# Patient Record
Sex: Female | Born: 1935 | Race: White | Hispanic: No | Smoking: Former smoker
Health system: Southern US, Community
[De-identification: ages and names within clinical notes are randomized; demographics above are authoritative.]

## PROBLEM LIST (undated history)

## (undated) DIAGNOSIS — I10 Essential (primary) hypertension: Secondary | ICD-10-CM

## (undated) DIAGNOSIS — C50919 Malignant neoplasm of unspecified site of unspecified female breast: Secondary | ICD-10-CM

## (undated) DIAGNOSIS — J449 Chronic obstructive pulmonary disease, unspecified: Secondary | ICD-10-CM

---

## 2004-09-23 ENCOUNTER — Ambulatory Visit: Payer: Self-pay | Admitting: Internal Medicine

## 2004-10-01 ENCOUNTER — Ambulatory Visit: Payer: Self-pay | Admitting: Unknown Physician Specialty

## 2004-11-30 ENCOUNTER — Ambulatory Visit: Payer: Self-pay | Admitting: Unknown Physician Specialty

## 2005-01-12 ENCOUNTER — Ambulatory Visit: Payer: Self-pay | Admitting: Unknown Physician Specialty

## 2006-10-23 ENCOUNTER — Ambulatory Visit: Payer: Self-pay | Admitting: Ophthalmology

## 2006-12-15 ENCOUNTER — Ambulatory Visit: Payer: Self-pay | Admitting: Internal Medicine

## 2007-07-31 ENCOUNTER — Ambulatory Visit: Payer: Self-pay | Admitting: Internal Medicine

## 2007-09-18 ENCOUNTER — Ambulatory Visit: Payer: Self-pay | Admitting: Unknown Physician Specialty

## 2007-10-02 ENCOUNTER — Ambulatory Visit: Payer: Self-pay | Admitting: Surgery

## 2007-10-03 ENCOUNTER — Ambulatory Visit: Payer: Self-pay | Admitting: Surgery

## 2008-02-27 ENCOUNTER — Ambulatory Visit: Payer: Self-pay | Admitting: Internal Medicine

## 2008-03-12 ENCOUNTER — Ambulatory Visit: Payer: Self-pay | Admitting: Unknown Physician Specialty

## 2009-04-02 ENCOUNTER — Ambulatory Visit: Payer: Self-pay | Admitting: Internal Medicine

## 2009-10-07 ENCOUNTER — Ambulatory Visit: Payer: Self-pay | Admitting: Ophthalmology

## 2009-10-19 ENCOUNTER — Ambulatory Visit: Payer: Self-pay | Admitting: Ophthalmology

## 2010-06-21 ENCOUNTER — Ambulatory Visit: Payer: Self-pay | Admitting: Internal Medicine

## 2011-09-06 ENCOUNTER — Ambulatory Visit: Payer: Self-pay | Admitting: Internal Medicine

## 2011-11-22 DIAGNOSIS — C50919 Malignant neoplasm of unspecified site of unspecified female breast: Secondary | ICD-10-CM

## 2011-11-22 HISTORY — DX: Malignant neoplasm of unspecified site of unspecified female breast: C50.919

## 2012-09-06 ENCOUNTER — Ambulatory Visit: Payer: Self-pay

## 2012-09-10 ENCOUNTER — Ambulatory Visit: Payer: Self-pay

## 2012-10-01 ENCOUNTER — Ambulatory Visit: Payer: Self-pay | Admitting: Surgery

## 2012-10-08 LAB — PATHOLOGY REPORT

## 2012-10-17 ENCOUNTER — Ambulatory Visit: Payer: Self-pay | Admitting: Surgery

## 2012-10-17 LAB — BASIC METABOLIC PANEL
Anion Gap: 3 — ABNORMAL LOW (ref 7–16)
Chloride: 102 mmol/L (ref 98–107)
Co2: 32 mmol/L (ref 21–32)
Creatinine: 0.43 mg/dL — ABNORMAL LOW (ref 0.60–1.30)
EGFR (African American): >60
EGFR (Non-African Amer.): >60
Potassium: 4.1 mmol/L (ref 3.5–5.1)
Sodium: 137 mmol/L (ref 136–145)

## 2012-10-17 LAB — CBC
MCH: 33.2 pg (ref 26.0–34.0)
Platelet: 200 10*3/uL (ref 150–440)
RBC: 3.89 10*6/uL (ref 3.80–5.20)
RDW: 14 % (ref 11.5–14.5)

## 2012-10-24 ENCOUNTER — Ambulatory Visit: Payer: Self-pay | Admitting: Surgery

## 2012-10-25 LAB — PATHOLOGY REPORT

## 2012-11-05 ENCOUNTER — Ambulatory Visit: Payer: Self-pay | Admitting: Internal Medicine

## 2012-11-21 ENCOUNTER — Ambulatory Visit: Payer: Self-pay | Admitting: Internal Medicine

## 2012-12-10 LAB — CBC CANCER CENTER
Basophil #: 0.1 x10 3/mm (ref 0.0–0.1)
Basophil %: 0.8 %
Eosinophil #: 0.2 x10 3/mm (ref 0.0–0.7)
Eosinophil %: 3.2 %
HCT: 40.4 % (ref 35.0–47.0)
HGB: 13.7 g/dL (ref 12.0–16.0)
Lymphocyte #: 1.8 x10 3/mm (ref 1.0–3.6)
Lymphocyte %: 27.9 %
MCH: 32.3 pg (ref 26.0–34.0)
MCHC: 33.9 g/dL (ref 32.0–36.0)
MCV: 95 fL (ref 80–100)
Monocyte #: 0.6 x10 3/mm (ref 0.2–0.9)
Neutrophil #: 3.8 x10 3/mm (ref 1.4–6.5)
RBC: 4.24 10*6/uL (ref 3.80–5.20)
RDW: 13.4 % (ref 11.5–14.5)
WBC: 6.4 x10 3/mm (ref 3.6–11.0)

## 2012-12-17 LAB — CBC CANCER CENTER
Basophil #: 0.1 x10 3/mm (ref 0.0–0.1)
Basophil %: 0.8 %
Eosinophil %: 2.8 %
HCT: 40.2 % (ref 35.0–47.0)
HGB: 13.4 g/dL (ref 12.0–16.0)
Lymphocyte #: 1.6 x10 3/mm (ref 1.0–3.6)
MCHC: 33.3 g/dL (ref 32.0–36.0)
Monocyte #: 0.6 x10 3/mm (ref 0.2–0.9)
Neutrophil #: 4.3 x10 3/mm (ref 1.4–6.5)
Neutrophil %: 64 %
Platelet: 189 x10 3/mm (ref 150–440)
RBC: 4.17 10*6/uL (ref 3.80–5.20)
WBC: 6.7 x10 3/mm (ref 3.6–11.0)

## 2012-12-22 ENCOUNTER — Ambulatory Visit: Payer: Self-pay | Admitting: Internal Medicine

## 2012-12-24 LAB — CBC CANCER CENTER
Basophil %: 0.8 %
Eosinophil #: 0.2 x10 3/mm (ref 0.0–0.7)
Eosinophil %: 2.6 %
HCT: 39.1 % (ref 35.0–47.0)
HGB: 13.2 g/dL (ref 12.0–16.0)
Lymphocyte #: 1.4 x10 3/mm (ref 1.0–3.6)
Lymphocyte %: 23.4 %
MCH: 32.3 pg (ref 26.0–34.0)
MCHC: 33.8 g/dL (ref 32.0–36.0)
MCV: 96 fL (ref 80–100)
Monocyte #: 0.6 x10 3/mm (ref 0.2–0.9)
Monocyte %: 9.7 %
Neutrophil #: 3.8 x10 3/mm (ref 1.4–6.5)
Neutrophil %: 63.5 %
RBC: 4.08 10*6/uL (ref 3.80–5.20)
RDW: 13.7 % (ref 11.5–14.5)

## 2012-12-31 LAB — CREATININE, SERUM
EGFR (African American): >60
EGFR (Non-African Amer.): >60

## 2012-12-31 LAB — CBC CANCER CENTER
Basophil %: 0.9 %
Eosinophil #: 0.2 x10 3/mm (ref 0.0–0.7)
Eosinophil %: 2.8 %
Lymphocyte #: 1.4 x10 3/mm (ref 1.0–3.6)
MCH: 32.2 pg (ref 26.0–34.0)
MCHC: 33.9 g/dL (ref 32.0–36.0)
Monocyte %: 10.8 %
Neutrophil %: 60.8 %
Platelet: 166 x10 3/mm (ref 150–440)
RBC: 4.05 10*6/uL (ref 3.80–5.20)
RDW: 13.5 % (ref 11.5–14.5)

## 2012-12-31 LAB — HEPATIC FUNCTION PANEL A (ARMC)
Albumin: 3.5 g/dL (ref 3.4–5.0)
Alkaline Phosphatase: 123 U/L (ref 50–136)
Bilirubin, Direct: <0.05 mg/dL (ref 0.00–0.20)
Bilirubin,Total: 0.3 mg/dL (ref 0.2–1.0)
SGOT(AST): 16 U/L (ref 15–37)
SGPT (ALT): 23 U/L (ref 12–78)
Total Protein: 7.1 g/dL (ref 6.4–8.2)

## 2013-01-07 LAB — CBC CANCER CENTER
Basophil #: 0.1 x10 3/mm (ref 0.0–0.1)
Eosinophil #: 0.2 x10 3/mm (ref 0.0–0.7)
Eosinophil %: 2.7 %
HCT: 37.8 % (ref 35.0–47.0)
Lymphocyte #: 1.3 x10 3/mm (ref 1.0–3.6)
Lymphocyte %: 22.7 %
MCH: 32.2 pg (ref 26.0–34.0)
MCHC: 34 g/dL (ref 32.0–36.0)
MCV: 95 fL (ref 80–100)
Neutrophil #: 3.8 x10 3/mm (ref 1.4–6.5)
Neutrophil %: 64 %
Platelet: 178 x10 3/mm (ref 150–440)
RBC: 3.98 10*6/uL (ref 3.80–5.20)
RDW: 14.2 % (ref 11.5–14.5)

## 2013-01-14 LAB — CBC CANCER CENTER
Basophil #: 0 x10 3/mm (ref 0.0–0.1)
Basophil %: 0.7 %
Eosinophil #: 0.2 x10 3/mm (ref 0.0–0.7)
Lymphocyte #: 1.4 x10 3/mm (ref 1.0–3.6)
MCH: 32.3 pg (ref 26.0–34.0)
MCV: 95 fL (ref 80–100)
Monocyte #: 0.8 x10 3/mm (ref 0.2–0.9)
Monocyte %: 11.6 %
Neutrophil #: 4.2 x10 3/mm (ref 1.4–6.5)
Neutrophil %: 63.8 %
Platelet: 191 x10 3/mm (ref 150–440)
RDW: 13.8 % (ref 11.5–14.5)

## 2013-01-19 ENCOUNTER — Ambulatory Visit: Payer: Self-pay | Admitting: Internal Medicine

## 2013-02-19 ENCOUNTER — Ambulatory Visit: Payer: Self-pay | Admitting: Internal Medicine

## 2013-03-21 ENCOUNTER — Ambulatory Visit: Payer: Self-pay | Admitting: Internal Medicine

## 2013-05-09 IMAGING — CT CT SIM MISC
1 series · 16 of 34 positions shown, 20 images · non-contrast
Comparison: none

[Series 2: — · axial · 1.17mm/px · z∈[-83,+115]mm · 16 of 75 slices shown, 20 images]
[im 6/75  mediastinal]
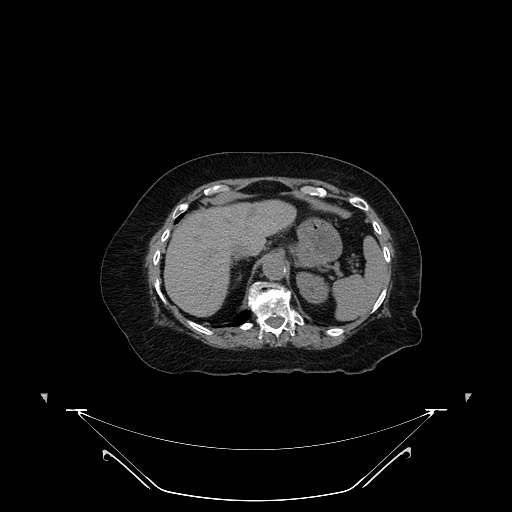
[im 6/75  lung]
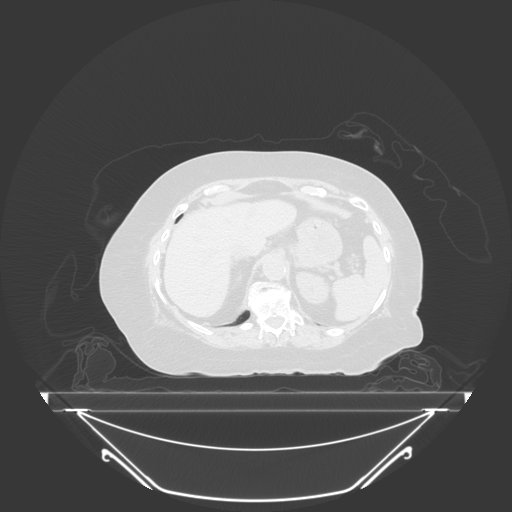
[im 11/75  lung]
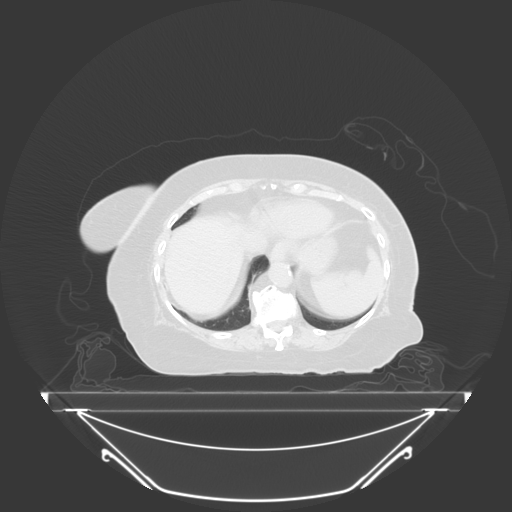
[im 15/75  lung]
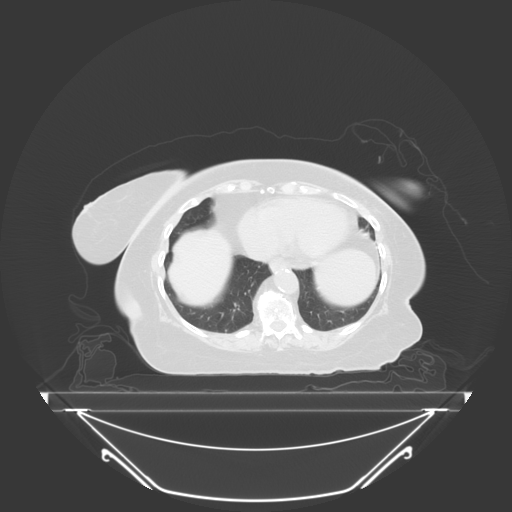
[im 20/75  lung]
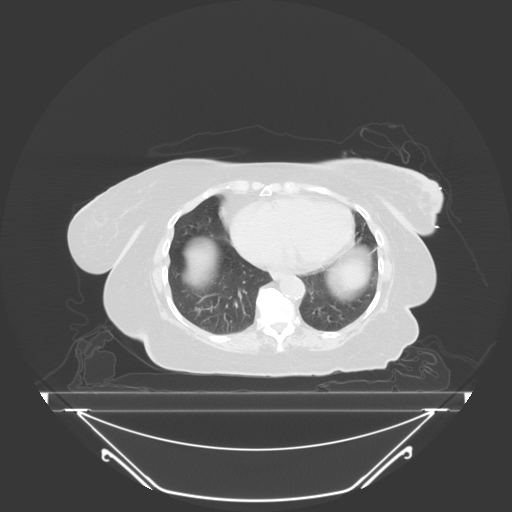
[im 25/75  mediastinal]
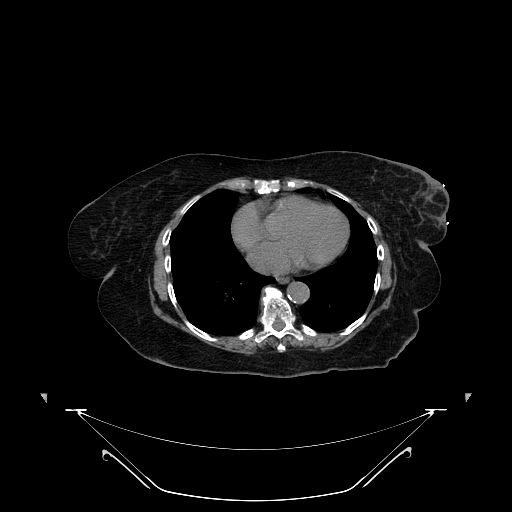
[im 25/75  lung]
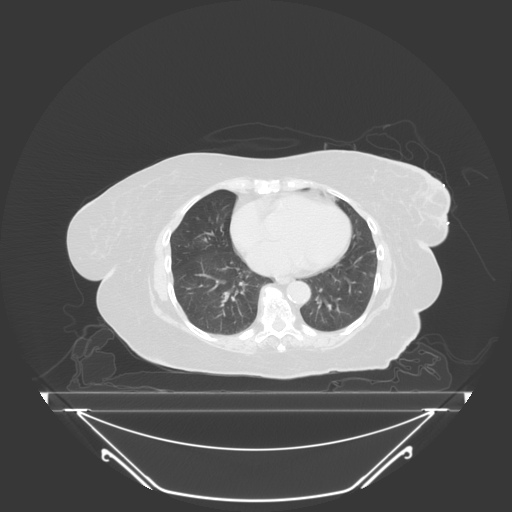
[im 30/75  lung]
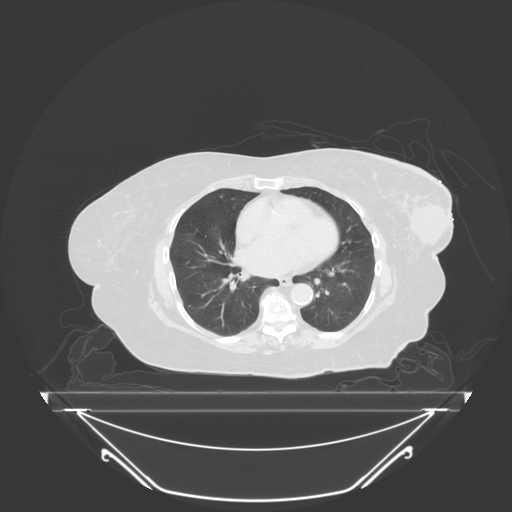
[im 33/75  lung]
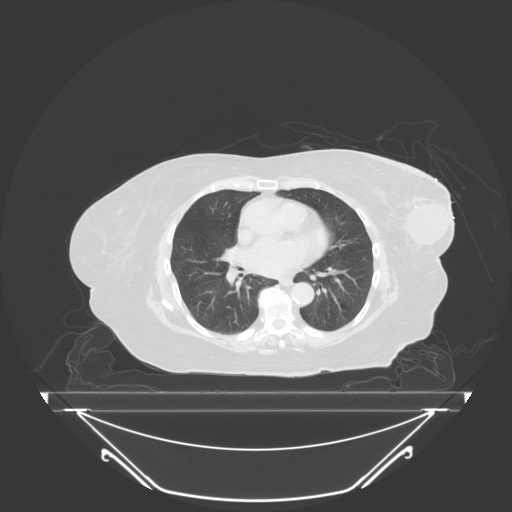
[im 36/75  lung]
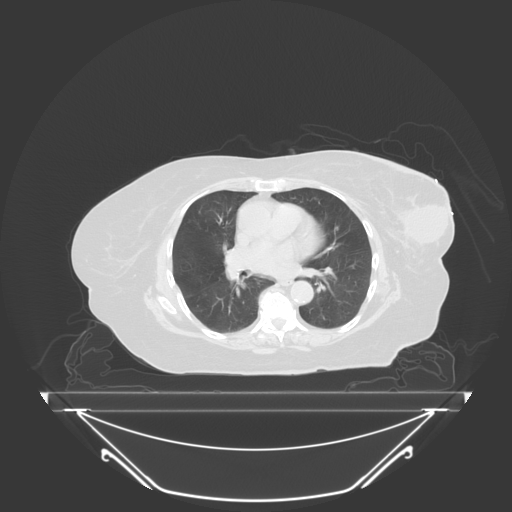
[im 41/75  mediastinal]
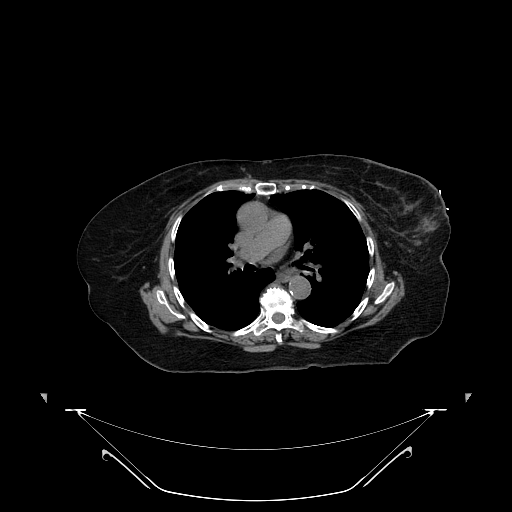
[im 41/75  lung]
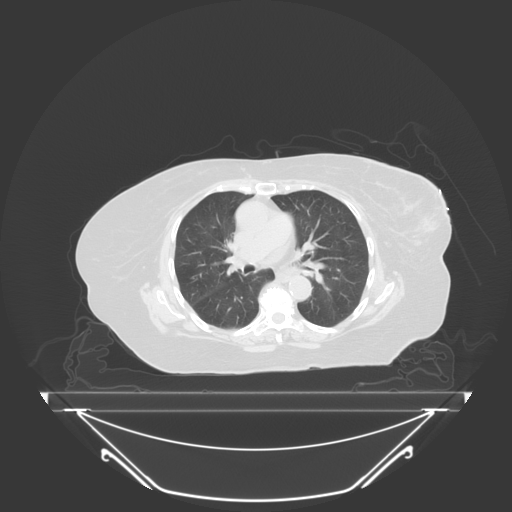
[im 44/75  lung]
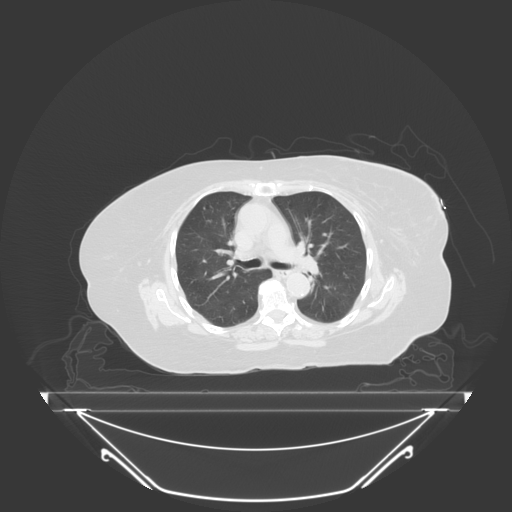
[im 47/75  lung]
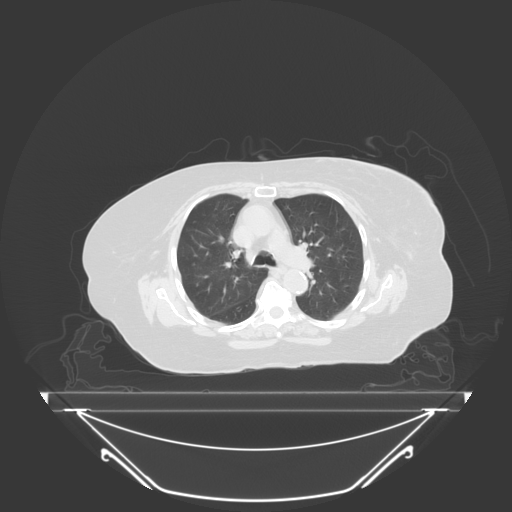
[im 53/75  lung]
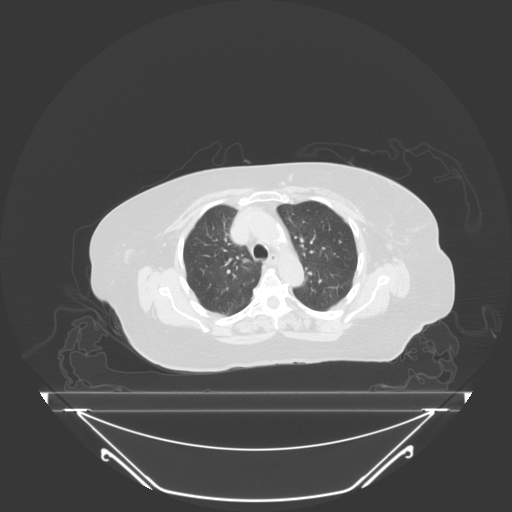
[im 58/75  mediastinal]
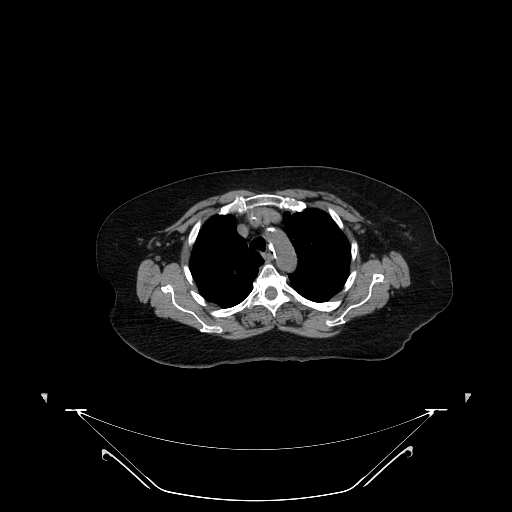
[im 58/75  lung]
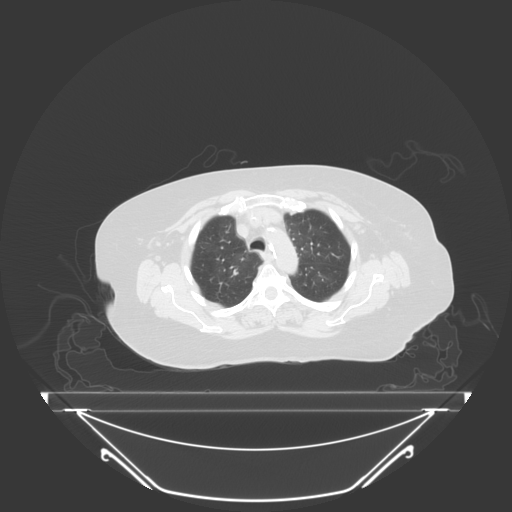
[im 61/75  lung]
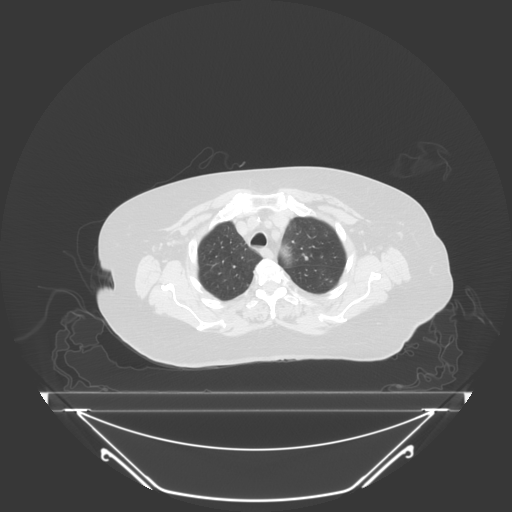
[im 66/75  lung]
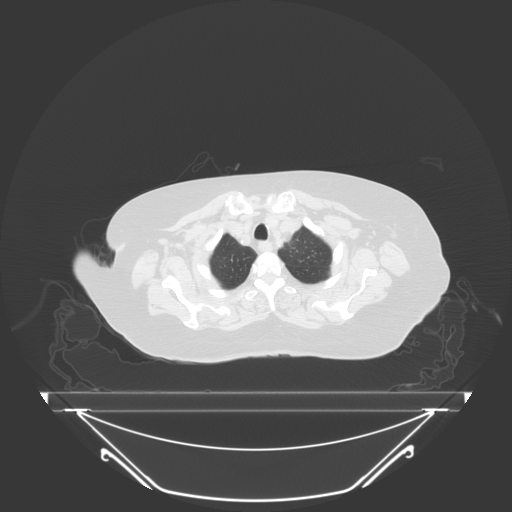
[im 72/75  lung]
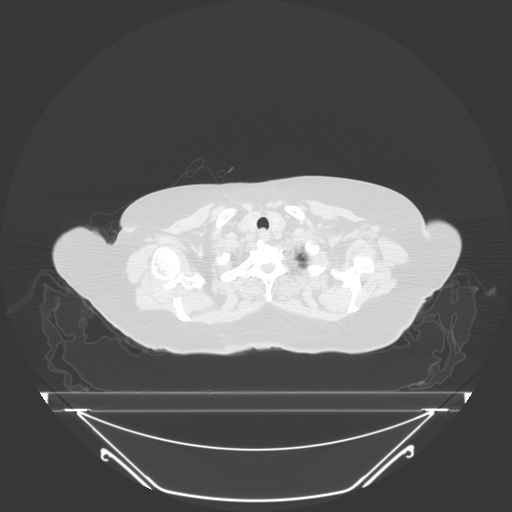

[16 of 34 positions shown; findings below may reference images not displayed]

IMAGES IMPORTED FROM THE [employer] WORKFLOW SYSTEM
NO DICTATION FOR STUDY

## 2013-05-14 IMAGING — CT CT SIM MISC
1 series · 16 of 33 positions shown, 20 images · non-contrast
Comparison: none

[Series 2: — · axial · 1.17mm/px · z∈[-672,-447]mm · 16 of 81 slices shown, 20 images]
[im 3/81  mediastinal]
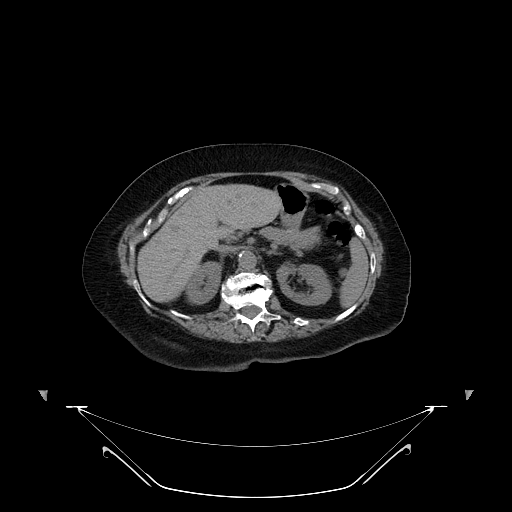
[im 3/81  lung]
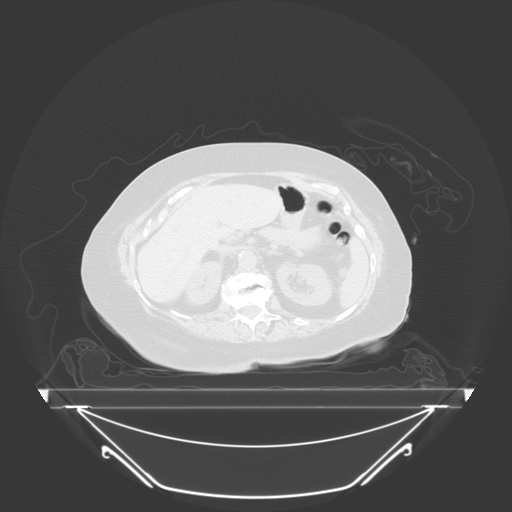
[im 9/81  lung]
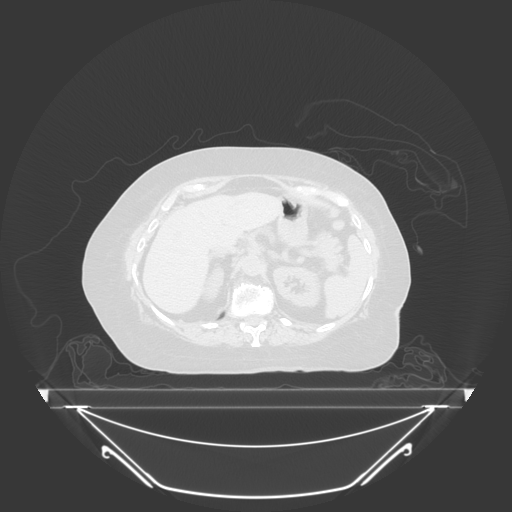
[im 15/81  lung]
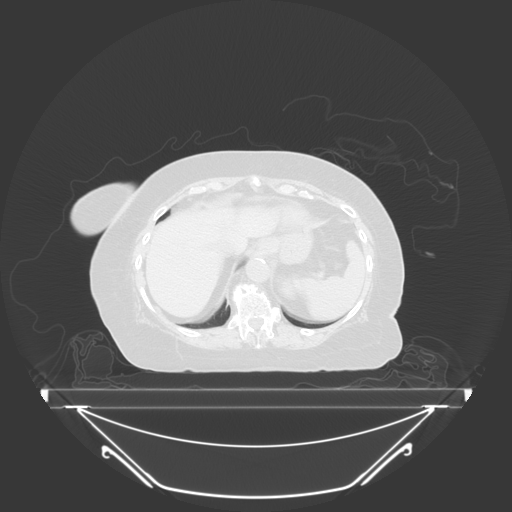
[im 18/81  lung]
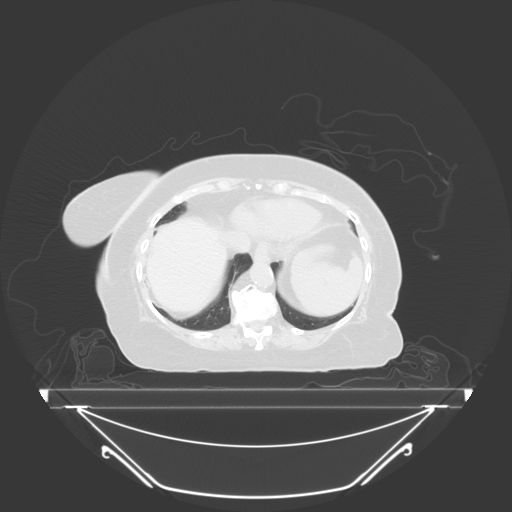
[im 24/81  mediastinal]
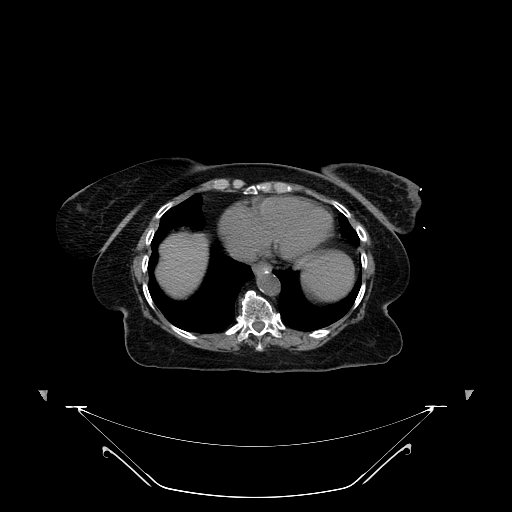
[im 24/81  lung]
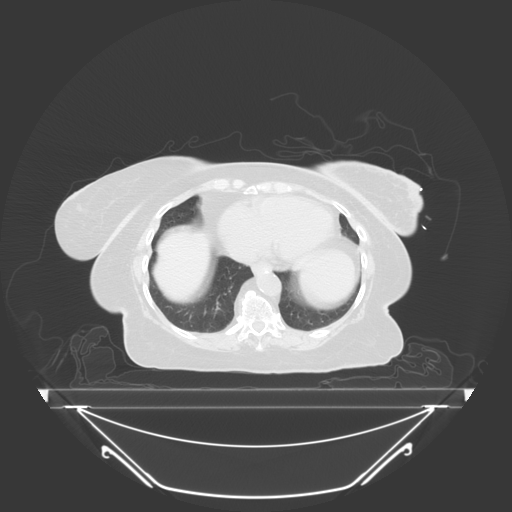
[im 30/81  lung]
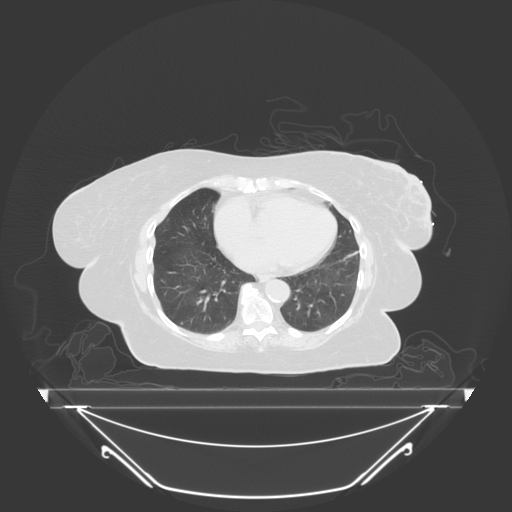
[im 36/81  lung]
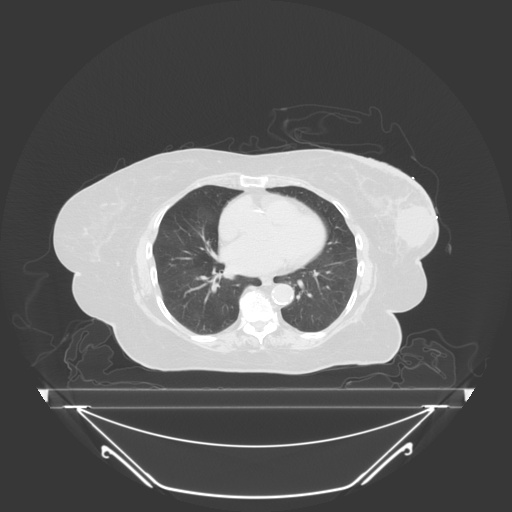
[im 39/81  lung]
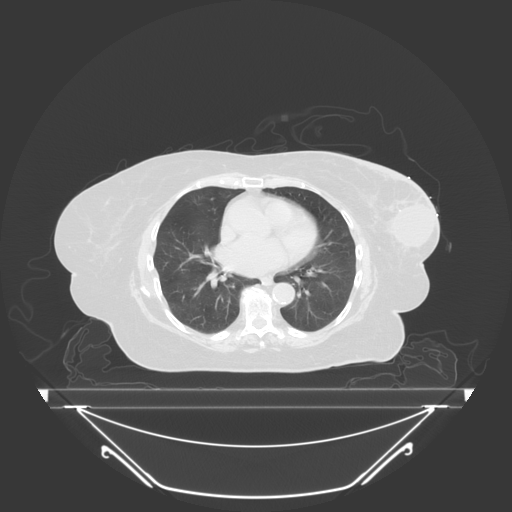
[im 44/81  mediastinal]
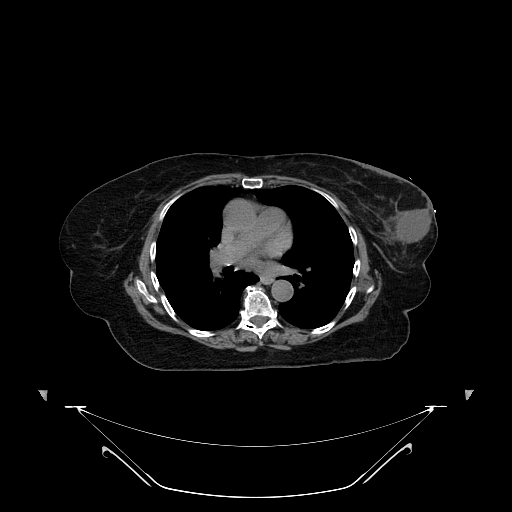
[im 44/81  lung]
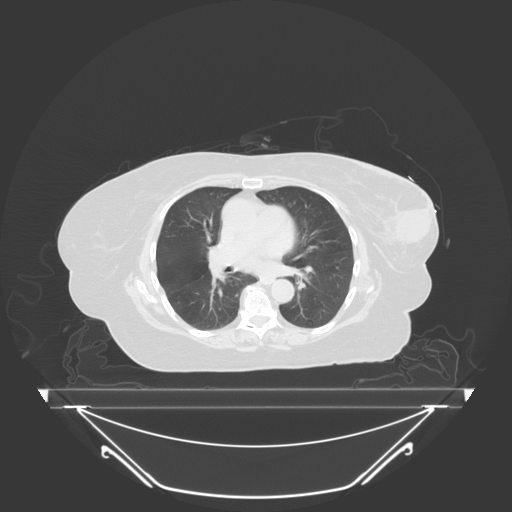
[im 48/81  lung]
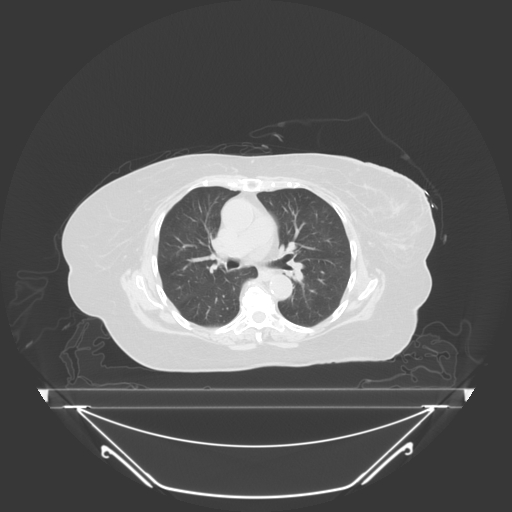
[im 51/81  lung]
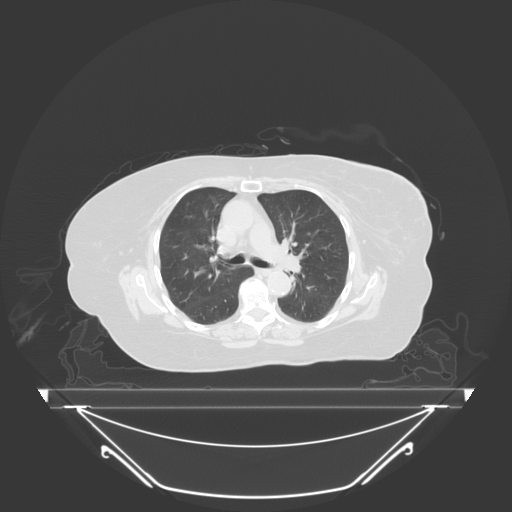
[im 57/81  lung]
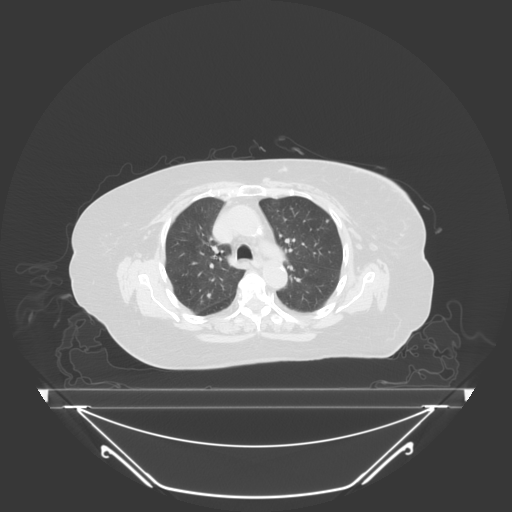
[im 63/81  mediastinal]
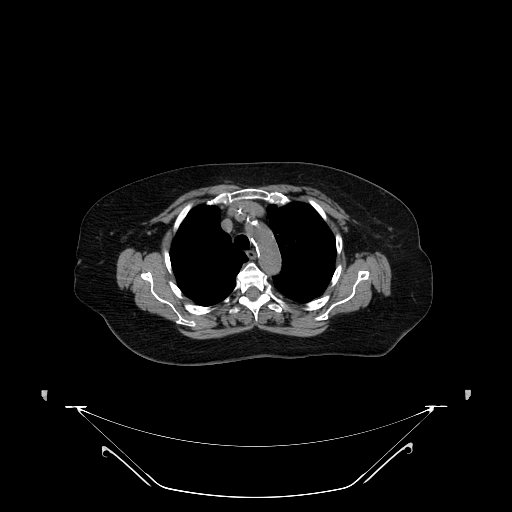
[im 63/81  lung]
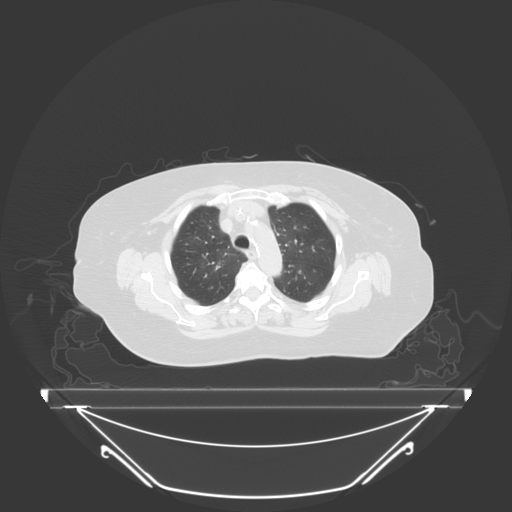
[im 66/81  lung]
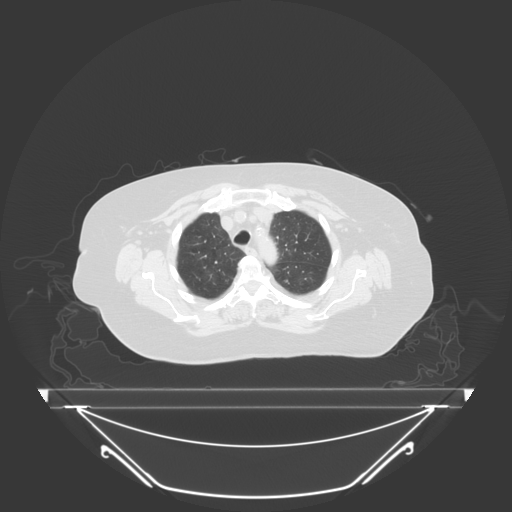
[im 72/81  lung]
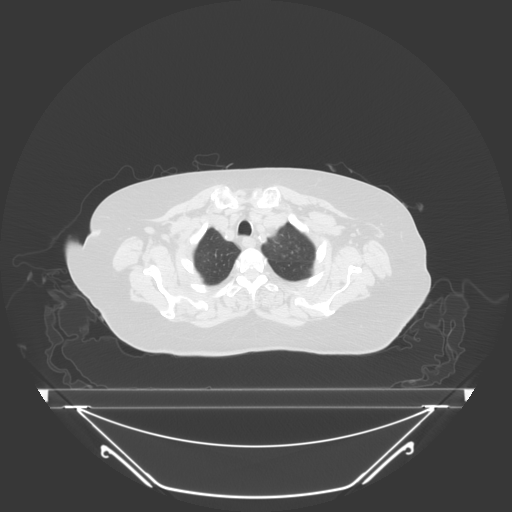
[im 78/81  lung]
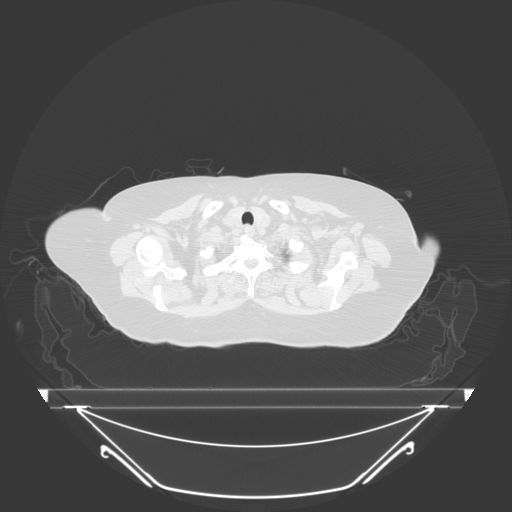

[16 of 33 positions shown; findings below may reference images not displayed]

IMAGES IMPORTED FROM THE [employer] WORKFLOW SYSTEM
NO DICTATION FOR STUDY

## 2013-08-12 ENCOUNTER — Ambulatory Visit: Payer: Self-pay | Admitting: Internal Medicine

## 2013-08-21 ENCOUNTER — Ambulatory Visit: Payer: Self-pay | Admitting: Internal Medicine

## 2013-10-23 ENCOUNTER — Ambulatory Visit: Payer: Self-pay

## 2013-11-22 ENCOUNTER — Ambulatory Visit: Payer: Self-pay | Admitting: Cardiology

## 2014-05-27 ENCOUNTER — Ambulatory Visit: Payer: Self-pay

## 2014-08-12 ENCOUNTER — Ambulatory Visit: Payer: Self-pay | Admitting: Internal Medicine

## 2014-08-21 ENCOUNTER — Ambulatory Visit: Payer: Self-pay | Admitting: Internal Medicine

## 2014-11-12 ENCOUNTER — Ambulatory Visit: Payer: Self-pay

## 2015-03-10 NOTE — Consult Note (Signed)
Reason for Visit: This 81 year old Female patient presents to the clinic for initial evaluation of  breast cancer .   Referred by Dr. Tamala Julian.  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 She had stereotactic needle biopsy showing ductal carcinoma in situ ER/PR positive. Went on to have a wide local excision for a 0.9 cm area of ductal carcinoma in situ intermediate grade 2 there was necrosis present margins were clear at one CM. She tolerated her surgery well. Surgery was October 24, 2012. She's been seen by medical oncology and recommendation for tamoxifen therapy has been made. She is now referred to radiation oncology for consideration of treatment. She is doing well breast is healing well. Specifically denies breast tenderness cough or bone pain.  Past Hx:    Asthma:    Glucoma:    Cataracts:    Lumpectomy:    Partial Hysterectomy:   Past, Family and Social History:   Past Medical History positive    Cardiovascular hypertension    Respiratory asthma; bronchitis    Endocrine glucose intolerance    Past Surgical History cholecystectomy; hysterectomy, cut cataract surgery carpal tunnel surgery C-section x4    Past Medical History Comments arthritis, obesity    Family History positive    Family History Comments family history of congestive heart disease stroke and son with bladder cancer at    Social History positive    Social History Comments 40-pack-year smoking history quit smoking in  76 no EtOH abuse history    Additional Past Medical and Surgical History accompanied by a friend today   Allergies:   Anti-inflammatories: Unknown  Celebrex: Resp. Distress, SOB, Wheezing  ASA: Other, Bleeding  Aspirin: Bleeding  Penicillin: Rash  Home Meds:  Home Medications: Medication Instructions Status  Flovent HFA 110 mcg/inh inhalation aerosol 2 puff(s) inhaled 2 times a day Active  hydrochlorothiazide-triamterene 25 mg-37.5 mg oral tablet 1 tab(s) orally once a day, after lunch Active  Combivent 18 mcg-103 mcg-/inh inhalation aerosol 1 puff(s) inhaled every 6 hours, As Needed Active   Review of Systems:   General negative    Performance Status (ECOG) 0    Skin negative    Breast see HPI    Ophthalmologic negative    ENMT negative    Respiratory and Thorax see HPI    Cardiovascular negative    Gastrointestinal negative    Genitourinary negative    Musculoskeletal negative    Neurological negative    Psychiatric negative    Hematology/Lymphatics negative    Endocrine negative    Allergic/Immunologic negative    Review of Systems   according to nurse's notes Patient denies any weight loss, fatigue, weakness, fever, chills or night sweats. Patient denies any loss of vision, blurred vision. Patient denies any ringing  of the ears or hearing loss. No irregular heartbeat. Patient denies heart murmur or history of fainting. Patient denies any chest pain or pain radiating to her upper extremities. Patient denies any shortness of breath, difficulty breathing  at night, cough or hemoptysis. Patient denies any swelling in the lower legs. Patient denies any nausea vomiting, vomiting of blood, or coffee ground material in the vomitus. Patient denies any stomach pain. Patient states has had normal bowel movements no significant constipation or diarrhea. Patient denies any dysuria, hematuria or significant nocturia. Patient denies any problems walking, swelling in the  joints or loss of balance. Patient denies any skin changes, loss of hair or loss of weight. Patient denies any excessive worrying or anxiety or significant depression. Patient denies any problems with insomnia. Patient denies excessive thirst, polyuria, polydipsia. Patient denies any swollen glands, patient denies easy bruising or easy bleeding. Patient denies any recent infections, allergies or URI. Patient "s visual fields have not changed significantly in recent time.  Nursing Notes:  Nursing Vital Signs and Chemo Nursing Nursing Notes: *CC Vital Signs Flowsheet:   16-Dec-13 11:18   Temp Temperature 96.7   Pulse Pulse 75   Respirations Respirations 18   SBP SBP 160   DBP DBP 80   Pain Scale (0-10)  0   Current Weight (kg) (kg) 85.7   Height (cm) centimeters 149.8   BSA (m2) 1.7   Physical Exam:  General/Skin/HEENT:   General normal    Skin normal    Eyes normal    ENMT normal    Head and Neck normal    Additional PE well-developed slightly obese female in NAD. Lungs are clear to A&P cardiac examination shows regular rate and rhythm. Left breast is a wide local excision scar that is healing well. No dominant mass or nodularity is noted in either breast in 2 position examined. No axillary or supraclavicular adenopathy is identified.   Breasts/Resp/CV/GI/GU:   Respiratory and Thorax normal    Cardiovascular normal    Gastrointestinal normal    Genitourinary normal   MS/Neuro/Psych/Lymph:   Musculoskeletal normal    Neurological normal    Lymphatics normal   Assessment and Plan:  Impression:   ductal carcinoma in situ of the left breast status post wide local excision ER/PR positive and 64-year-old female  Plan:   this time I have recommended whole breast radiation. She is rather large breasted slight like to take it slow on her dose we'll treat up to 5000 cGy over 5 weeks. We'll also boost or scar another 1400 cGy using electrons. Risks and benefits of treatment  were reviewed with the patient and she seems to comprehend my treatment plan well. I have set her up for CT simulation shortly after the Christmas holiday. Patient will also be a candidate for tamoxifen after completion of radiation and will refer her back to Dr. Ma Hillock for that.risks and benefits of treatment including irritation of the skin, fibrosis of the breast, fatigue, alteration blood counts, and fatigue were all explained in detail to the patient.I discussed the case personally with Dr. Ma Hillock who concurs with the treatment plan.  I would like to take this opportunity to thank you for allowing me to continue to participate in this patient's care.    CC Referral:   cc: Dr. Tamala Julian, Dr. Adrian Prows   Electronic Signatures: Baruch Gouty, Roda Shutters (MD)  (Signed 16-Dec-13 15:21)  Authored: HPI, Diagnosis, Past Hx, PFSH, Allergies, Home Meds, ROS, Nursing Notes, Physical Exam, Encounter Assessment and Plan, CC Referring Physician   Last Updated: 16-Dec-13 15:21 by Armstead Peaks (MD)

## 2015-03-10 NOTE — Op Note (Signed)
PATIENT NAME:  Brenda Collins, Brenda Collins MR#:  [id number] DATE OF BIRTH:  01-22-1930  DATE OF PROCEDURE:  10/24/2012  PREOPERATIVE DIAGNOSIS: Carcinoma of the left breast.   POSTOPERATIVE DIAGNOSIS: Carcinoma of the left breast.   PROCEDURE: Left partial mastectomy.   SURGEON: Loreli Dollar, MD  ANESTHESIA: General.   INDICATIONS: This 81 year old female recently had a mammogram depicting a cluster of microcalcifications in the outer aspect of the left breast. She had core needle biopsy with findings of ductal carcinoma in situ and surgery was recommended for definitive treatment. It is also noted in the preoperative physical exam it was noted she had a hematoma surrounding the biopsy site. Some short period of time was allowed for resolution.   DESCRIPTION OF PROCEDURE: She is brought to the Operating Room for definitive procedure. Did have preoperative insertion of Kopans wire. The mammograms were reviewed seeing the Kopans wire and the location of the biopsy marker and hematoma. Patient was placed on the operating table in the supine position under general anesthesia. The dressing was removed from the left breast exposing the Kopans wire which entered the breast at the 3:00 position. The Kopans wire was cut 2 cm from the skin. The breast was prepared with ChloraPrep, draped in a sterile manner.   An incision was made from approximately 2:00 to 4:00 position of the peripheral aspect of the left breast directly overlying the palpable mass and also overlying the site of the entrance point of Kopans wire. An ellipse of skin approximately 12 mm wide was excised with the underlying specimen. Dissection was carried down through subcutaneous tissues and the hematoma was palpated. The Kopans wire was observed and a mass of breast tissue was excised using electrocautery for hemostasis. One small artery was suture ligated with 4-0 chromic. Further dissection carried out to completely excise the mass and the mass  itself with the surrounding tissue was approximately 4 x 4 x 6 cm in dimension and was marked with margin maps, suturing markers to the specimen to mark the cranial, caudal, medial, lateral margins and also the 4:00 position of the skin ellipse was tagged with a 3-0 nylon stitch. The specimen was submitted for pathology.   Several small bleeding points were cauterized. The wound was observed and hemostasis was subsequently determined to be intact. Deeper tissues were infiltrated with 0.5% Sensorcaine with epinephrine and also subcutaneous tissues. Next, subcutaneous tissues were closed with interrupted 4-0 chromic inverted sutures. Next, the skin was closed with running 4-0 Monocryl subcuticular suture.   With time the pathologist called back to indicate that the margins appeared clear and the biopsy site was in the central aspect of the specimen and was submitted for routine pathology. The wound was treated with Dermabond. Patient tolerated surgery satisfactorily and was then prepared for transfer to the recovery room.    ____________________________ Lenna Sciara. Rochel Brome, MD jws:cms D: 10/24/2012 12:50:39 ET T: 10/24/2012 13:05:53 ET JOB#: [id number]  cc: Loreli Dollar, MD, <Dictator> Loreli Dollar MD ELECTRONICALLY SIGNED 10/24/2012 19:47

## 2015-08-12 ENCOUNTER — Encounter: Payer: Self-pay | Admitting: Radiation Oncology

## 2015-08-12 ENCOUNTER — Ambulatory Visit
Admission: RE | Admit: 2015-08-12 | Discharge: 2015-08-12 | Disposition: A | Discharge status: Home / Self Care | Payer: Medicare Other | Source: Ambulatory Visit | Attending: Radiation Oncology | Admitting: Radiation Oncology

## 2015-08-12 VITALS — BP 183/80 | HR 61 | Temp 97.6°F | Resp 20 | Wt 170.2 lb

## 2015-08-12 DIAGNOSIS — C50912 Malignant neoplasm of unspecified site of left female breast: Secondary | ICD-10-CM

## 2015-08-12 HISTORY — DX: Chronic obstructive pulmonary disease, unspecified: J44.9

## 2015-08-12 HISTORY — DX: Malignant neoplasm of unspecified site of unspecified female breast: C50.919

## 2015-08-12 HISTORY — DX: Essential (primary) hypertension: I10

## 2015-08-12 NOTE — Progress Notes (Signed)
Radiation Oncology Follow up Note  Name: Brenda Collins   Date:   08/12/2015 MRN:  [id number] DOB: 1930/06/29    This 81 y.o. female presents to the clinic today for follow-up for breast cancer.  REFERRING Katherina Wimer: No ref. Sefora Tietje found  HPI: Patient is an 81 year old female now out over 3 and half years having completed radiation therapy to her left breast for ductal carcinoma in situ ER/PR positive. Patient has significant arthritis and has refused tamoxifen therapy. She is seen today in routine follow-up and is doing well. She does complain of significant joint pain especially in her hands. She had a mammogram back in December which was fine another one is scheduled for the next several months. She specifically denies breast tenderness cough or bone pain.  COMPLICATIONS OF TREATMENT: none  FOLLOW UP COMPLIANCE: keeps appointments   PHYSICAL EXAM:  BP 183/80 mmHg  Pulse 61  Temp(Src) 97.6 F (36.4 C)  Resp 20  Wt 170 lb 3.1 oz (77.2 kg) Lungs are clear to A&P cardiac examination essentially unremarkable with regular rate and rhythm. No dominant mass or nodularity is noted in either breast in 2 positions examined. Incision is well-healed. No axillary or supraclavicular adenopathy is appreciated. Cosmetic result is excellent. Still some cholangiolar attic changes of the skin in her high-dose scar boost region although cosmetic result is still good to excellent. Well-developed well-nourished patient in NAD. HEENT reveals PERLA, EOMI, discs not visualized.  Oral cavity is clear. No oral mucosal lesions are identified. Neck is clear without evidence of cervical or supraclavicular adenopathy. Lungs are clear to A&P. Cardiac examination is essentially unremarkable with regular rate and rhythm without murmur rub or thrill. Abdomen is benign with no organomegaly or masses noted. Motor sensory and DTR levels are equal and symmetric in the upper and lower extremities. Cranial nerves II through XII  are grossly intact. Proprioception is intact. No peripheral adenopathy or edema is identified. No motor or sensory levels are noted. Crude visual fields are within normal range.   RADIOLOGY RESULTS: Prior mammograms are reviewed  PLAN: At the present time she continues to do well with no evidence of disease. I've asked to see her back in 1 year for follow-up and then will discontinue follow-up care as will be close to 5 years out. Patient knows to call with any concerns at any time.  I would like to take this opportunity for allowing me to participate in the care of your patient.Armstead Peaks., MD

## 2016-08-03 ENCOUNTER — Encounter: Payer: Self-pay | Admitting: Emergency Medicine

## 2016-08-03 ENCOUNTER — Emergency Department: Payer: Medicare Other

## 2016-08-03 ENCOUNTER — Inpatient Hospital Stay
Admission: EM | Admit: 2016-08-03 | Discharge: 2016-08-04 | DRG: 309 | Disposition: A | Discharge status: Home / Self Care | Payer: Medicare Other | Attending: Specialist | Admitting: Specialist

## 2016-08-03 DIAGNOSIS — I480 Paroxysmal atrial fibrillation: Principal | ICD-10-CM | POA: Diagnosis present

## 2016-08-03 DIAGNOSIS — E871 Hypo-osmolality and hyponatremia: Secondary | ICD-10-CM

## 2016-08-03 DIAGNOSIS — R778 Other specified abnormalities of plasma proteins: Secondary | ICD-10-CM

## 2016-08-03 DIAGNOSIS — Z880 Allergy status to penicillin: Secondary | ICD-10-CM

## 2016-08-03 DIAGNOSIS — Z79899 Other long term (current) drug therapy: Secondary | ICD-10-CM | POA: Diagnosis not present

## 2016-08-03 DIAGNOSIS — R0600 Dyspnea, unspecified: Secondary | ICD-10-CM

## 2016-08-03 DIAGNOSIS — I4891 Unspecified atrial fibrillation: Secondary | ICD-10-CM | POA: Diagnosis present

## 2016-08-03 DIAGNOSIS — I200 Unstable angina: Secondary | ICD-10-CM

## 2016-08-03 DIAGNOSIS — J449 Chronic obstructive pulmonary disease, unspecified: Secondary | ICD-10-CM | POA: Diagnosis present

## 2016-08-03 DIAGNOSIS — I272 Other secondary pulmonary hypertension: Secondary | ICD-10-CM | POA: Diagnosis present

## 2016-08-03 DIAGNOSIS — Z87891 Personal history of nicotine dependence: Secondary | ICD-10-CM | POA: Diagnosis not present

## 2016-08-03 DIAGNOSIS — T502X5A Adverse effect of carbonic-anhydrase inhibitors, benzothiadiazides and other diuretics, initial encounter: Secondary | ICD-10-CM | POA: Diagnosis present

## 2016-08-03 DIAGNOSIS — Z853 Personal history of malignant neoplasm of breast: Secondary | ICD-10-CM | POA: Diagnosis not present

## 2016-08-03 DIAGNOSIS — I10 Essential (primary) hypertension: Secondary | ICD-10-CM | POA: Diagnosis present

## 2016-08-03 DIAGNOSIS — Z888 Allergy status to other drugs, medicaments and biological substances status: Secondary | ICD-10-CM | POA: Diagnosis not present

## 2016-08-03 DIAGNOSIS — R7989 Other specified abnormal findings of blood chemistry: Secondary | ICD-10-CM

## 2016-08-03 LAB — CBC
HCT: 43.1 % (ref 35.0–47.0)
HEMOGLOBIN: 14.9 g/dL (ref 12.0–16.0)
MCH: 32.5 pg (ref 26.0–34.0)
MCHC: 34.6 g/dL (ref 32.0–36.0)
MCV: 93.8 fL (ref 80.0–100.0)
PLATELETS: 293 10*3/uL (ref 150–440)
RBC: 4.59 MIL/uL (ref 3.80–5.20)
RDW: 13.8 % (ref 11.5–14.5)
WBC: 11.6 10*3/uL — ABNORMAL HIGH (ref 3.6–11.0)

## 2016-08-03 LAB — BASIC METABOLIC PANEL
ANION GAP: 11 (ref 5–15)
BUN: 19 mg/dL (ref 6–20)
CALCIUM: 9.8 mg/dL (ref 8.9–10.3)
CO2: 24 mmol/L (ref 22–32)
Chloride: 92 mmol/L — ABNORMAL LOW (ref 101–111)
Creatinine, Ser: 0.63 mg/dL (ref 0.44–1.00)
GFR calc non Af Amer: >60 mL/min (ref >60)
Glucose, Bld: 169 mg/dL — ABNORMAL HIGH (ref 65–99)
Potassium: 3.8 mmol/L (ref 3.5–5.1)
SODIUM: 127 mmol/L — AB (ref 135–145)

## 2016-08-03 LAB — TSH: TSH: 1.144 u[IU]/mL (ref 0.350–4.500)

## 2016-08-03 LAB — TROPONIN I: TROPONIN I: 0.03 ng/mL — AB (ref <0.03)

## 2016-08-03 LAB — PHOSPHORUS: Phosphorus: 2.7 mg/dL (ref 2.5–4.6)

## 2016-08-03 LAB — MAGNESIUM: Magnesium: 2.1 mg/dL (ref 1.7–2.4)

## 2016-08-03 MED ORDER — DILTIAZEM LOAD VIA INFUSION
10.0000 mg | Freq: Once | INTRAVENOUS | Status: AC
  Administered 2016-08-03: 10 mg via INTRAVENOUS
  Filled 2016-08-03: qty 10

## 2016-08-03 MED ORDER — ENOXAPARIN SODIUM 40 MG/0.4ML ~~LOC~~ SOLN
40.0000 mg | SUBCUTANEOUS | Status: DC
  Administered 2016-08-03: 40 mg via SUBCUTANEOUS

## 2016-08-03 MED ORDER — MAGNESIUM OXIDE 400 MG PO TABS
400.0000 mg | ORAL_TABLET | Freq: Every day | ORAL | Status: DC
  Administered 2016-08-03: 400 mg via ORAL
  Filled 2016-08-03 (×3): qty 1

## 2016-08-03 MED ORDER — ACETAMINOPHEN 650 MG RE SUPP: 650.0000 mg | Freq: Four times a day (QID) | RECTAL | Status: DC | PRN

## 2016-08-03 MED ORDER — DILTIAZEM HCL 30 MG PO TABS
30.0000 mg | ORAL_TABLET | Freq: Four times a day (QID) | ORAL | Status: DC
  Administered 2016-08-03 – 2016-08-04 (×3): 30 mg via ORAL
  Filled 2016-08-03 (×5): qty 1

## 2016-08-03 MED ORDER — TIOTROPIUM BROMIDE MONOHYDRATE 18 MCG IN CAPS
18.0000 ug | ORAL_CAPSULE | Freq: Every day | RESPIRATORY_TRACT | Status: DC
  Administered 2016-08-04: 18 ug via RESPIRATORY_TRACT
  Filled 2016-08-03: qty 5

## 2016-08-03 MED ORDER — DILTIAZEM HCL 100 MG IV SOLR
5.0000 mg/h | INTRAVENOUS | Status: DC
  Administered 2016-08-03: 5 mg/h via INTRAVENOUS
  Filled 2016-08-03: qty 100

## 2016-08-03 MED ORDER — SODIUM CHLORIDE 0.9 % IV SOLN
Freq: Once | INTRAVENOUS | Status: AC
Start: 2016-08-03 — End: 2016-08-03
  Administered 2016-08-03: 22:00:00 via INTRAVENOUS

## 2016-08-03 MED ORDER — ENOXAPARIN SODIUM 80 MG/0.8ML ~~LOC~~ SOLN
1.0000 mg/kg | Freq: Once | SUBCUTANEOUS | Status: DC
  Filled 2016-08-03: qty 0.8

## 2016-08-03 MED ORDER — ACETAMINOPHEN 325 MG PO TABS: 650.0000 mg | ORAL_TABLET | Freq: Four times a day (QID) | ORAL | Status: DC | PRN

## 2016-08-03 MED ORDER — ONDANSETRON HCL 4 MG PO TABS
4.0000 mg | ORAL_TABLET | Freq: Four times a day (QID) | ORAL | Status: DC | PRN
Start: 2016-08-03 — End: 2016-08-04

## 2016-08-03 MED ORDER — ASPIRIN 81 MG PO CHEW
324.0000 mg | CHEWABLE_TABLET | Freq: Once | ORAL | Status: AC
  Administered 2016-08-03: 324 mg via ORAL
  Filled 2016-08-03: qty 4

## 2016-08-03 MED ORDER — ENOXAPARIN SODIUM 80 MG/0.8ML ~~LOC~~ SOLN
1.0000 mg/kg | Freq: Once | SUBCUTANEOUS | Status: AC
  Administered 2016-08-03: 75 mg via SUBCUTANEOUS
  Filled 2016-08-03: qty 0.8

## 2016-08-03 MED ORDER — ALBUTEROL SULFATE (2.5 MG/3ML) 0.083% IN NEBU: 2.5000 mg | INHALATION_SOLUTION | Freq: Four times a day (QID) | RESPIRATORY_TRACT | Status: DC | PRN

## 2016-08-03 MED ORDER — ONDANSETRON HCL 4 MG/2ML IJ SOLN: 4.0000 mg | Freq: Four times a day (QID) | INTRAMUSCULAR | Status: DC | PRN

## 2016-08-03 MED ORDER — FLUTICASONE FUROATE-VILANTEROL 100-25 MCG/INH IN AEPB
1.0000 | INHALATION_SPRAY | Freq: Every day | RESPIRATORY_TRACT | Status: DC
  Administered 2016-08-04: 1 via RESPIRATORY_TRACT
  Filled 2016-08-03: qty 28

## 2016-08-03 MED ORDER — DILTIAZEM HCL 25 MG/5ML IV SOLN
5.0000 mg | Freq: Once | INTRAVENOUS | Status: AC
  Administered 2016-08-03: 5 mg via INTRAVENOUS
  Filled 2016-08-03: qty 5

## 2016-08-03 MED ORDER — VITAMIN B-12 1000 MCG PO TABS
1000.0000 ug | ORAL_TABLET | Freq: Every day | ORAL | Status: DC
  Administered 2016-08-04: 1000 ug via ORAL
  Filled 2016-08-03: qty 1

## 2016-08-03 NOTE — ED Provider Notes (Signed)
[facility] Emergency Department Provider Note   ____________________________________________   First MD Initiated Contact with Patient 08/03/16 1620     (approximate)  I have reviewed the triage vital signs and the nursing notes.   HISTORY  Chief Complaint Shortness of Breath and Chest Pain   HPI Brenda Collins is a 82 y.o. female patient reports when she walks she gets short of breath and has chest tightness and heaviness occasionally this radiates up to the neck and jaw. Today she is having some chest tightness while she is laying in the emergency room. Patient reports she's had these symptoms for a while but cannot say for how long. Initially she cannot say if they're getting worse. Patient cannot tell me if she's had an irregular heartbeat in the past. She does tell me that her aspirin allergy as if she passed a she takes aspirin she passes blood after while.  Past Medical History:  Diagnosis Date  . Breast cancer ([place])   . COPD (chronic obstructive pulmonary disease) ([place])   . Hypertension     Patient Active Problem List   Diagnosis Date Noted  . A-fib ([place]) 08/03/2016    History reviewed. No pertinent surgical history.  Prior to Admission medications   Medication Sig Start Date End Date Taking? Authorizing Provider  acetaminophen (TYLENOL) 500 MG tablet Take 1,000 mg by mouth 3 (three) times daily.    Yes Historical Provider, MD  Cyanocobalamin (RA VITAMIN B-12 TR) 1000 MCG TBCR Take 1 tablet by mouth daily.    Yes Historical Provider, MD  Fluticasone Furoate-Vilanterol 100-25 MCG/INH AEPB Inhale 1 puff into the lungs daily.  06/08/15  Yes Historical Provider, MD  magnesium oxide (MAG-OX) 400 MG tablet Take 400 mg by mouth at bedtime.    Yes Historical Provider, MD  tiotropium (SPIRIVA) 18 MCG inhalation capsule Place 18 mcg into inhaler and inhale daily.  06/08/15 08/03/16 Yes Historical Provider, MD  triamterene-hydrochlorothiazide  (MAXZIDE-25) 37.5-25 MG per tablet TAKE ONE (1) TABLET BY MOUTH EVERY DAY 05/26/15  Yes Historical Provider, MD  aspirin EC 81 MG tablet Take 1 tablet (81 mg total) by mouth daily. 08/04/16   Henreitta Leber, MD  benzocaine-menthol (CHLORAEPTIC) 6-10 MG lozenge Take 1 lozenge by mouth as needed.     Historical Provider, MD  diltiazem (CARDIZEM CD) 180 MG 24 hr capsule Take 1 capsule (180 mg total) by mouth daily. 08/05/16   Henreitta Leber, MD    Allergies Aspirin; Nabumetone; and Penicillins  History reviewed. No pertinent family history.  Social History Social History  Substance Use Topics  . Smoking status: Former Smoker    Quit date: 12/11/1972  . Smokeless tobacco: Not on file  . Alcohol use No    Review of Systems onstitutional: No fever/chills Eyes: No visual changes. ENT: No sore throat. Cardiovascular: See history of present illness. Respiratory: See history of present illness Gastrointestinal: No abdominal pain.  No nausea, no vomiting.  No diarrhea.  No constipation. Genitourinary: Negative for dysuria. Musculoskeletal: Negative for back pain. Skin: Negative for rash. Neurological: Negative for headaches, focal weakness or numbness.  10-point ROS otherwise negative.  ____________________________________________   PHYSICAL EXAM:  VITAL SIGNS: ED Triage Vitals  Enc Vitals Group     BP 08/03/16 1520 (!) 160/54     Pulse Rate 08/03/16 1520 98     Resp 08/03/16 1520 18     Temp 08/03/16 1520 97.8 F (36.6 C)     Temp Source  08/03/16 1520 Oral     SpO2 08/03/16 1520 95 %     Weight 08/03/16 1520 169 lb (76.7 kg)     Height 08/03/16 1520 4\' 9"  (1.448 m)     Head Circumference --      Peak Flow --      Pain Score 08/03/16 1521 3     Pain Loc --      Pain Edu? --      Excl. in [place]? --     Constitutional: Alert and oriented. Well appearing and in no acute distress. Eyes: Conjunctivae are normal. PERRL. EOMI. Head: Atraumatic. Nose: No  congestion/rhinnorhea. Mouth/Throat: Mucous membranes are moist.  Oropharynx non-erythematous. Neck: No stridor.   Cardiovascular: Normal rate, regular rhythm. Grossly normal heart sounds.  Good peripheral circulation. Respiratory: Normal respiratory effort.  No retractions. Lungs CTAB. Gastrointestinal: Soft and nontender. No distention. No abdominal bruits. No CVA tenderness. Musculoskeletal: No lower extremity tenderness nor edema.  No joint effusions. Neurologic:  Normal speech and language. No gross focal neurologic deficits are appreciated. No gait instability. Skin:  Skin is warm, dry and intact. No rash noted. Psychiatric: Mood and affect are normal. Speech and behavior are normal.  ____________________________________________   LABS (all labs ordered are listed, but only abnormal results are displayed)  Labs Reviewed  BASIC METABOLIC PANEL - Abnormal; Notable for the following:       Result Value   Sodium 127 (*)    Chloride 92 (*)    Glucose, Bld 169 (*)    All other components within normal limits  CBC - Abnormal; Notable for the following:    WBC 11.6 (*)    All other components within normal limits  TROPONIN I - Abnormal; Notable for the following:    Troponin I 0.03 (*)    All other components within normal limits  BASIC METABOLIC PANEL - Abnormal; Notable for the following:    Sodium 132 (*)    Chloride 98 (*)    Glucose, Bld 117 (*)    All other components within normal limits  TSH  MAGNESIUM  PHOSPHORUS   ____________________________________________  EKG  EKG read and interpreted by me shows A. fib with rapid ventricular response rate of 128 there is ST and T-wave segment depression laterally in the limb leads which may be rate related. ____________________________________________  RADIOLOGY   ____________________________________________   PROCEDURES  Procedure(s) performed:   Procedures  Critical Care  performed:  ____________________________________________   INITIAL IMPRESSION / ASSESSMENT AND PLAN / ED COURSE  Pertinent labs & imaging results that were available during my care of the patient were reviewed by me and considered in my medical decision making (see chart for details).    Clinical Course     ____________________________________________   FINAL CLINICAL IMPRESSION(S) / ED DIAGNOSES  Final diagnoses:  Dyspnea  Hyponatremia  Elevated troponin  Atrial fibrillation with tachycardic ventricular rate (HCC)  Unstable angina (HCC)      NEW MEDICATIONS STARTED DURING THIS VISIT:  Discharge Medication List as of 08/04/2016  2:46 PM    START taking these medications   Details  aspirin EC 81 MG tablet Take 1 tablet (81 mg total) by mouth daily., Starting Thu 08/04/2016, Print    diltiazem (CARDIZEM CD) 180 MG 24 hr capsule Take 1 capsule (180 mg total) by mouth daily., Starting Fri 08/05/2016, Print         Note:  This document was prepared using Dragon voice recognition software and may include  unintentional dictation errors.    Nena Polio, MD 08/12/16 [phone]

## 2016-08-03 NOTE — H&P (Signed)
Brenda Collins at [place] NAME: Brenda Collins    MR#:  [id number]  DATE OF BIRTH:  Aug 18, 1930  DATE OF ADMISSION:  08/03/2016  PRIMARY CARE PHYSICIAN: Leonel Ramsay, MD   REQUESTING/REFERRING PHYSICIAN: Dr Cinda Quest  CHIEF COMPLAINT:  Shortness of breath  and chest pressure on and off for 1-2 days  HISTORY OF PRESENT ILLNESS:  Brenda Collins  is a 82 y.o. female with a known history of COPD who uses oxygen at nighttime, pulmonary hypertension by right-sided cardiac catheter done few years ago, history of breast cancer, hypertension comes to the emergency room with increasing shortness of breath and chest pressure on and off along with palpitation she was found to be in A. fib with RVR with heart rate in the 120s she is blood pressure is stable. She was started on IV Cardizem drip at 5 mg per hour. Patient denies any history of atrial fibrillation or irregular rhythm in the past. She was also found to have sodium of 127. Patient has been taking hydrochlorothiazide/triamterene and has been following a low-sodium diet. Patient is being admitted for further evaluation and management of her A. fib.  PAST MEDICAL HISTORY:   Past Medical History:  Diagnosis Date  . Breast cancer ([place])   . COPD (chronic obstructive pulmonary disease) ([place])   . Hypertension     PAST SURGICAL HISTOIRY:  History reviewed. No pertinent surgical history.  SOCIAL HISTORY:   Social History  Substance Use Topics  . Smoking status: Former Smoker    Quit date: 12/11/1972  . Smokeless tobacco: Not on file  . Alcohol use No    FAMILY HISTORY:  No family history on file.  DRUG ALLERGIES:   Allergies  Allergen Reactions  . Aspirin Other (See Comments)    Other (See Comments) - was taking 2 of the extra strength aspirins at the time.  Causes bleeding in stools   . Nabumetone     Other reaction(s): Other (See Comments) Blood in stools--all arthritis meds   . Penicillins Other (See Comments)    Joint swelling and aching Has patient had a PCN reaction causing immediate rash, facial/tongue/throat swelling, SOB or lightheadedness with hypotension: No Has patient had a PCN reaction causing severe rash involving mucus membranes or skin necrosis: No Has patient had a PCN reaction that required hospitalization No Has patient had a PCN reaction occurring within the last 10 years: No If all of the above answers are "NO", then may proceed with Cephalosporin use.     REVIEW OF SYSTEMS:  Review of Systems  Constitutional: Negative for chills, fever and weight loss.  HENT: Negative for ear discharge, ear pain and nosebleeds.   Eyes: Negative for blurred vision, pain and discharge.  Respiratory: Positive for shortness of breath. Negative for sputum production, wheezing and stridor.   Cardiovascular: Positive for chest pain and palpitations. Negative for orthopnea and PND.  Gastrointestinal: Negative for abdominal pain, diarrhea, nausea and vomiting.  Genitourinary: Negative for frequency and urgency.  Musculoskeletal: Negative for back pain and joint pain.  Neurological: Positive for weakness. Negative for sensory change, speech change and focal weakness.  Psychiatric/Behavioral: Negative for depression and hallucinations. The patient is not nervous/anxious.      MEDICATIONS AT HOME:   Prior to Admission medications   Medication Sig Start Date End Date Taking? Authorizing Provider  acetaminophen (TYLENOL) 500 MG tablet Take 1,000 mg by mouth 3 (three) times daily.    Yes Historical Provider,  MD  Cyanocobalamin (RA VITAMIN B-12 TR) 1000 MCG TBCR Take 1 tablet by mouth daily.    Yes Historical Provider, MD  Fluticasone Furoate-Vilanterol 100-25 MCG/INH AEPB Inhale 1 puff into the lungs daily.  06/08/15  Yes Historical Provider, MD  magnesium oxide (MAG-OX) 400 MG tablet Take 400 mg by mouth at bedtime.    Yes Historical Provider, MD  predniSONE  (DELTASONE) 10 MG tablet Take 10 mg by mouth daily with breakfast. Taper dose 40mg  for 2 days, 30mg  for 2 days 20mg  for 2 days and 10mg  for 2 days. 07/29/16 08/05/16 Yes Historical Provider, MD  tiotropium (SPIRIVA) 18 MCG inhalation capsule Place 18 mcg into inhaler and inhale daily.  06/08/15 08/03/16 Yes Historical Provider, MD  triamterene-hydrochlorothiazide (MAXZIDE-25) 37.5-25 MG per tablet TAKE ONE (1) TABLET BY MOUTH EVERY DAY 05/26/15  Yes Historical Provider, MD  benzocaine-menthol (CHLORAEPTIC) 6-10 MG lozenge Take 1 lozenge by mouth as needed.     Historical Provider, MD      VITAL SIGNS:  Blood pressure (!) 143/86, pulse 89, temperature 97.8 F (36.6 C), temperature source Oral, resp. rate 19, height 4\' 9"  (1.448 m), weight 76.7 kg (169 lb), SpO2 97 %.  PHYSICAL EXAMINATION:  GENERAL:  82 y.o.-year-old patient lying in the bed with no acute distress.  EYES: Pupils equal, round, reactive to light and accommodation. No scleral icterus. Extraocular muscles intact.  HEENT: Head atraumatic, normocephalic. Oropharynx and nasopharynx clear.  NECK:  Supple, no jugular venous distention. No thyroid enlargement, no tenderness.  LUNGS: Normal breath sounds bilaterally, no wheezing, rales,rhonchi or crepitation. No use of accessory muscles of respiration.  CARDIOVASCULAR: S1, S2 normal.Irregularly irregular rhythm No murmurs, rubs, or gallops.  ABDOMEN: Soft, nontender, nondistended. Bowel sounds present. No organomegaly or mass.  EXTREMITIES: No pedal edema, cyanosis, or clubbing.  NEUROLOGIC: Cranial nerves II through XII are intact. Muscle strength 5/5 in all extremities. Sensation intact. Gait not checked.  PSYCHIATRIC: The patient is alert and oriented x 3.  SKIN: No obvious rash, lesion, or ulcer.   LABORATORY PANEL:   CBC  Recent Labs Lab 08/03/16 1523  WBC 11.6*  HGB 14.9  HCT 43.1  PLT 293    ------------------------------------------------------------------------------------------------------------------  Chemistries   Recent Labs Lab 08/03/16 1523  NA 127*  K 3.8  CL 92*  CO2 24  GLUCOSE 169*  BUN 19  CREATININE 0.63  CALCIUM 9.8   ------------------------------------------------------------------------------------------------------------------  Cardiac Enzymes  Recent Labs Lab 08/03/16 1523  TROPONINI 0.03*   ------------------------------------------------------------------------------------------------------------------  RADIOLOGY:  Dg Chest Portable 1 View  Result Date: 08/03/2016 CLINICAL DATA:  Patient states she has chest pain and SOB today which has worsened. Patient states she has a HX of COPD. EXAM: PORTABLE CHEST 1 VIEW COMPARISON:  None. FINDINGS: Normal mediastinum and cardiac silhouette. Normal pulmonary vasculature. No evidence of effusion, infiltrate, or pneumothorax. No acute bony abnormality. IMPRESSION: No acute cardiopulmonary process. Electronically Signed   By: Suzy Bouchard M.D.   On: 08/03/2016 16:56    EKG:   A. fib with RVR, LVH IMPRESSION AND PLAN:   Camber Drinnen  is a 83 y.o. female with a known history of COPD who uses oxygen at nighttime, pulmonary hypertension by right-sided cardiac catheter done few years ago, history of breast cancer, hypertension comes to the emergency room with increasing shortness of breath and chest pressure on and off along with palpitation she was found to be in A. fib with RVR with heart rate in the 120s she is blood pressure is  stable  1. Rapid A. fib with RVR new onset -Patient currently started on Cardizem drip will add 30 mg every 6 hourly oral Cardizem to get her off the drip -Patient currently is in sinus rhythm as reported by the nurse. We'll hold off on subcutaneous Lovenox -Get a stat EKG to ensure she is in sinus rhythm -Dr. Clayborn Bigness informed -Check TSH magnesium phosphorus -Echo of  the heart -Cardiac enzymes 3  2. COPD with history of pulmonary hypertension -Patient uses oxygen 2 L nasal cannula at nighttime will continue that -She does not seem to be in COPD exacerbation. She just finished a course of prednisone as outpatient and her sats are stable which is not wheezing Continue inhalers and breathing treatments as needed  3. Hyponatremia -Appears due to low sodium diet and hydrochlorothiazide -We'll give her some IV fluids and discontinue HCTZ -Use alternative medication for her blood pressure for now she is on by mouth Cardizem that should suffice  4. DVT prophylaxis subcutaneous Lovenox  5. Hypertension -Patient is off her hydrochlorothiazide. Cardizem 30 every 6 is being given for rapid A. fib should suffice for her blood pressure right now  Case discussed with Dr. Clayborn Bigness  All the records are reviewed and case discussed with ED provider. Management plans discussed with the patient, family and they are in agreement.  CODE STATUS: full  TOTAL TIME TAKING CARE OF THIS PAT50 minutes.    Timotheus Salm M.D on 08/03/2016 at 6:29 PM  Between 7am to 6pm - Pager - [phone]  After 6pm go to [email or web address] - password [email or web address] [facility]  [facility]  Office  [phone]  CC: Primary care physician; Leonel Ramsay, MD

## 2016-08-03 NOTE — ED Notes (Signed)
Took pt to bathroom and hooked her up to the monitor.

## 2016-08-03 NOTE — ED Notes (Signed)
Pt out of A-Fib at this time, EKG printed and given to Posey Pronto, MD. Cardizem drip stopped.

## 2016-08-03 NOTE — ED Provider Notes (Signed)
[facility] Emergency Department Provider Note   ____________________________________________   First MD Initiated Contact with Patient 08/03/16 1620     (approximate)  I have reviewed the triage vital signs and the nursing notes.   HISTORY  Chief Complaint Shortness of Breath and Chest Pain    HPI Brenda Collins is a 82 y.o. female reports she has been having shortness of breath and chest pressure worse when she walks today is present even when she is lying still in bed. She does not remember having any kind of tachycardia before. She reports many years ago she had an occasional irregular heartbeat but does not remember any irregular heartbeat on her more intense bases. She is not able to walk even 10 feet without becoming short of breath and having chest pressure. She is not having any nausea or sweating occasionally she has pain in her neck or jaw. This is always worse with exertion.   Past Medical History:  Diagnosis Date  . Breast cancer ([place])   . COPD (chronic obstructive pulmonary disease) ([place])   . Hypertension     There are no active problems to display for this patient.   History reviewed. No pertinent surgical history.  Prior to Admission medications   Medication Sig Start Date End Date Taking? Authorizing Provider  acetaminophen (TYLENOL) 500 MG tablet Take 1,000 mg by mouth 3 (three) times daily.    Yes Historical Provider, MD  Cyanocobalamin (RA VITAMIN B-12 TR) 1000 MCG TBCR Take 1 tablet by mouth daily.    Yes Historical Provider, MD  Fluticasone Furoate-Vilanterol 100-25 MCG/INH AEPB Inhale 1 puff into the lungs daily.  06/08/15  Yes Historical Provider, MD  magnesium oxide (MAG-OX) 400 MG tablet Take 400 mg by mouth at bedtime.    Yes Historical Provider, MD  predniSONE (DELTASONE) 10 MG tablet Take 10 mg by mouth daily with breakfast. Taper dose 40mg  for 2 days, 30mg  for 2 days 20mg  for 2 days and 10mg  for 2 days. 07/29/16 08/05/16  Yes Historical Provider, MD  tiotropium (SPIRIVA) 18 MCG inhalation capsule Place 18 mcg into inhaler and inhale daily.  06/08/15 08/03/16 Yes Historical Provider, MD  triamterene-hydrochlorothiazide (MAXZIDE-25) 37.5-25 MG per tablet TAKE ONE (1) TABLET BY MOUTH EVERY DAY 05/26/15  Yes Historical Provider, MD  benzocaine-menthol (CHLORAEPTIC) 6-10 MG lozenge Take 1 lozenge by mouth as needed.     Historical Provider, MD    Allergies Aspirin; Nabumetone; and Penicillins  No family history on file.  Social History Social History  Substance Use Topics  . Smoking status: Former Smoker    Quit date: 12/11/1972  . Smokeless tobacco: Not on file  . Alcohol use No    Review of Systems Constitutional: No fever/chills Eyes: No visual changes. ENT: No sore throat. Cardiovascular:chest pain. Respiratory:  shortness of breath. Gastrointestinal: No abdominal pain.  No nausea, no vomiting.  No diarrhea.  No constipation. Genitourinary: Negative for dysuria. Musculoskeletal: Negative for back pain. Skin: Negative for rash. Neurological: Negative for headaches, focal weakness or numbness.  10-point ROS otherwise negative.  ____________________________________________   PHYSICAL EXAM:  VITAL SIGNS: ED Triage Vitals  Enc Vitals Group     BP 08/03/16 1520 (!) 160/54     Pulse Rate 08/03/16 1520 98     Resp 08/03/16 1520 18     Temp 08/03/16 1520 97.8 F (36.6 C)     Temp Source 08/03/16 1520 Oral     SpO2 08/03/16 1520 95 %  Weight 08/03/16 1520 169 lb (76.7 kg)     Height 08/03/16 1520 4\' 9"  (1.448 m)     Head Circumference --      Peak Flow --      Pain Score 08/03/16 1521 3     Pain Loc --      Pain Edu? --      Excl. in [place]? --     Constitutional: Alert and oriented. Well appearing and in no acute distress. Eyes: Conjunctivae are normal. PERRL. EOMI. Head: Atraumatic. Nose: No congestion/rhinnorhea. Mouth/Throat: Mucous membranes are moist.  Oropharynx  non-erythematous. Neck: No stridor.   Cardiovascular: Irregularly irregular rhythm tachycardic rate. Grossly normal heart sounds.  Good peripheral circulation. Respiratory: Normal respiratory effort.  No retractions. Lungs CTAB. Gastrointestinal: Soft and nontender. No distention. No abdominal bruits. No CVA tenderness. Musculoskeletal: No lower extremity tenderness nor edema.  No joint effusions. Neurologic:  Normal speech and language. No gross focal neurologic deficits are appreciated. No gait instability. Skin:  Skin is warm, dry and intact. No rash noted. Psychiatric: Mood and affect are normal. Speech and behavior are normal.  ____________________________________________   LABS (all labs ordered are listed, but only abnormal results are displayed)  Labs Reviewed  BASIC METABOLIC PANEL - Abnormal; Notable for the following:       Result Value   Sodium 127 (*)    Chloride 92 (*)    Glucose, Bld 169 (*)    All other components within normal limits  CBC - Abnormal; Notable for the following:    WBC 11.6 (*)    All other components within normal limits  TROPONIN I - Abnormal; Notable for the following:    Troponin I 0.03 (*)    All other components within normal limits  TSH   ____________________________________________  EKG  EKG shows atrial fibrillation with rapid ventricular response at a rate of 128 likely rate related changes ____________________________________________  RADIOLOGY PORTABLE CHEST 1 VIEW  COMPARISON:  None.  FINDINGS: Normal mediastinum and cardiac silhouette. Normal pulmonary vasculature. No evidence of effusion, infiltrate, or pneumothorax. No acute bony abnormality.  IMPRESSION: No acute cardiopulmonary process.   Electronically Signed   By: Suzy Bouchard M.D.   On: 08/03/2016 16:56  ____________________________________________   PROCEDURES  Procedure(s) performed:   Procedures  Critical Care  performed:  ____________________________________________   INITIAL IMPRESSION / ASSESSMENT AND PLAN / ED COURSE  Pertinent labs & imaging results that were available during my care of the patient were reviewed by me and considered in my medical decision making (see chart for details).    Clinical Course     ____________________________________________   FINAL CLINICAL IMPRESSION(S) / ED DIAGNOSES  Final diagnoses:  Dyspnea  Hyponatremia  Elevated troponin  Atrial fibrillation with tachycardic ventricular rate (HCC)      NEW MEDICATIONS STARTED DURING THIS VISIT:  New Prescriptions   No medications on file     Note:  This document was prepared using Dragon voice recognition software and may include unintentional dictation errors.    Nena Polio, MD 08/03/16 [phone]

## 2016-08-03 NOTE — Progress Notes (Signed)
Patient converted transiently to normal sinus rhythm which showed on EKG however at present on telemetry she is afebrile with heart rate 79-83 -Patient is off Cardizem drip -We'll continue by mouth Cardizem 30 every 6 hourly -I will go ahead and give 1 dose of Lovenox for now and defer to cardiology tomorrow for long-term anticoagulation

## 2016-08-03 NOTE — ED Notes (Signed)
Medical MD at bedside, Posey Pronto.

## 2016-08-03 NOTE — ED Triage Notes (Addendum)
Pt reports shortness of breath, elevated BP and chest pressure "for a while". Pt reports hx of asthma and COPD. Pt with even and nonlabored respirations in triage room. Skin warm and dry. Pt reports left sided jaw and neck pain. Pt with no hx of afib.

## 2016-08-04 ENCOUNTER — Inpatient Hospital Stay
Admit: 2016-08-04 | Discharge: 2016-08-04 | Disposition: A | Discharge status: Home / Self Care | Payer: Medicare Other | Attending: Specialist | Admitting: Specialist

## 2016-08-04 LAB — BASIC METABOLIC PANEL
ANION GAP: 7 (ref 5–15)
BUN: 15 mg/dL (ref 6–20)
CO2: 27 mmol/L (ref 22–32)
Calcium: 9.4 mg/dL (ref 8.9–10.3)
Chloride: 98 mmol/L — ABNORMAL LOW (ref 101–111)
Creatinine, Ser: 0.61 mg/dL (ref 0.44–1.00)
GFR calc Af Amer: >60 mL/min (ref >60)
GLUCOSE: 117 mg/dL — AB (ref 65–99)
POTASSIUM: 3.7 mmol/L (ref 3.5–5.1)
Sodium: 132 mmol/L — ABNORMAL LOW (ref 135–145)

## 2016-08-04 MED ORDER — DILTIAZEM HCL ER COATED BEADS 180 MG PO CP24: 180.0000 mg | ORAL_CAPSULE | Freq: Every day | ORAL | 1 refills | Status: AC

## 2016-08-04 MED ORDER — DILTIAZEM HCL ER COATED BEADS 180 MG PO CP24
180.0000 mg | ORAL_CAPSULE | Freq: Every day | ORAL | Status: DC
  Administered 2016-08-04: 180 mg via ORAL
  Filled 2016-08-04: qty 1

## 2016-08-04 MED ORDER — ASPIRIN EC 81 MG PO TBEC: 81.0000 mg | DELAYED_RELEASE_TABLET | Freq: Every day | ORAL | 1 refills | Status: AC

## 2016-08-04 NOTE — Care Management (Signed)
Brenda Collins is doing well, her arthritis is still an issue, but she said she feels better than she did on arrival.

## 2016-08-04 NOTE — Care Management (Signed)
Patient admitted with new onset atrial fib requiring cardizem drip.  It is currently being weaned and transitioning to oral.  She lives alone, drives herself, and denies issues accessing medical care or obtaining her medications.  She has oxygen that she uses at night from [place]. She has a walker that she uses when she gets up at night to go to the bathroom and outside.  She is anxious to return home to her puppy.  At present aspirin is being used for anticoagulation and chronic anticoagulation will be determined as an outpatient.

## 2016-08-04 NOTE — Care Management Important Message (Signed)
Important Message  Patient Details  Name: Brenda Collins MRN: [id number] Date of Birth: 08-06-1930   Medicare Important Message Given:  Yes    Katrina Stack, RN 08/04/2016, 9:39 AM

## 2016-08-04 NOTE — Progress Notes (Signed)
Brenda Collins 82 y/o female. Patient was transferred from the ER following c/o of acute onset of worsening SOB and chest pressure. On admission to the unit patient was able to ambulate to her room with an aid of a walker. She denied being in any pain at the time of admission. Patient was oriented to her room, and educated on fall prevention. Patent maintained a stable VS, with a rhythm that goes back and forth between afib and NSR. Patent denied any symptom at this time. Patient needed items were placed within reach. Will continue to monitor.

## 2016-08-04 NOTE — Evaluation (Signed)
Physical Therapy Evaluation Patient Details Name: Brenda Collins MRN: [id number] DOB: 01/24/1930 Today's Date: 08/04/2016   History of Present Illness  82 yo female wtih onset of a-fib and PMHx:  COPD and HTN was admitted for care of her heart, has PT ordered to determine if she can return home.  Clinical Impression  Pt is up to walk with nursing when PT arrived and will expect her to get home with daughter as she plans.  Spoke with pt and case manager to report her condition and her functional outcome.    Follow Up Recommendations Home health PT;Supervision for mobility/OOB    Equipment Recommendations  None recommended by PT (owns a walker already)    Recommendations for Other Services Rehab consult     Precautions / Restrictions Precautions Precautions: Fall (telemetry) Restrictions Weight Bearing Restrictions: No      Mobility  Bed Mobility Overal bed mobility: Needs Assistance Bed Mobility: Supine to Sit;Sit to Supine     Supine to sit: Min guard Sit to supine: Min guard   General bed mobility comments: cues for sequence and assist to scoot fully out to EOB  Transfers Overall transfer level: Needs assistance Equipment used: Rolling walker (2 wheeled);1 person hand held assist Transfers: Sit to/from [facility] Sit to Stand: Min guard Stand pivot transfers: Min guard       General transfer comment: cued hand placement every trial  Ambulation/Gait Ambulation/Gait assistance: Min guard Ambulation Distance (Feet): 120 Feet Assistive device: Rolling walker (2 wheeled) Gait Pattern/deviations: Step-through pattern;Trunk flexed;Wide base of support;Shuffle;Decreased stride length Gait velocity: reduced Gait velocity interpretation: Below normal speed for age/gender General Gait Details: complains her L knee is buckled but is mainly a pain issue  Stairs            Wheelchair Mobility    Modified Rankin (Stroke Patients Only)        Balance Overall balance assessment: Needs assistance Sitting-balance support: Bilateral upper extremity supported;Feet supported Sitting balance-Leahy Scale: Fair   Postural control: Posterior lean Standing balance support: Bilateral upper extremity supported Standing balance-Leahy Scale: Poor                               Pertinent Vitals/Pain Pain Assessment: No/denies pain    Home Living Family/patient expects to be discharged to:: Private residence Living Arrangements: Alone Available Help at Discharge: Family;Available PRN/intermittently Type of Home: House Home Access: Stairs to enter Entrance Stairs-Rails: Can reach both;Left;Right Entrance Stairs-Number of Steps: 3 Home Layout: One level Home Equipment: Walker - 2 wheels;Cane - single point      Prior Function Level of Independence: Independent with assistive device(s)               Hand Dominance        Extremity/Trunk Assessment   Upper Extremity Assessment: Generalized weakness           Lower Extremity Assessment: Generalized weakness      Cervical / Trunk Assessment: Kyphotic  Communication   Communication: HOH  Cognition Arousal/Alertness: Awake/alert Behavior During Therapy: WFL for tasks assessed/performed Overall Cognitive Status: Within Functional Limits for tasks assessed                      General Comments General comments (skin integrity, edema, etc.): Pt is up to walk with minor assistance and will have her daughter staying with her per pt.  With family assistance can go  home with HHPT and will not expect equipment needs for now.    Exercises        Assessment/Plan    PT Assessment Patient needs continued PT services  PT Diagnosis Abnormality of gait;Difficulty walking;Generalized weakness   PT Problem List Decreased strength;Decreased range of motion;Decreased balance;Decreased activity tolerance;Decreased mobility;Decreased coordination;Decreased  knowledge of use of DME;Decreased safety awareness;Cardiopulmonary status limiting activity  PT Treatment Interventions DME instruction;Gait training;Stair training;Functional mobility training;Therapeutic activities;Therapeutic exercise;Balance training;Neuromuscular re-education;Patient/family education   PT Goals (Current goals can be found in the Care Plan section) Acute Rehab PT Goals Patient Stated Goal: to get home as soon as possible PT Goal Formulation: With patient Time For Goal Achievement: 08/18/16 Potential to Achieve Goals: Good    Frequency Min 2X/week   Barriers to discharge Decreased caregiver support      Co-evaluation               End of Session Equipment Utilized During Treatment: Gait belt Activity Tolerance: Patient tolerated treatment well   Nurse Communication: Mobility status;Other (comment) (discharge planning)         Time: 1348-1410 PT Time Calculation (min) (ACUTE ONLY): 22 min   Charges:   PT Evaluation $PT Eval Moderate Complexity: 1 Procedure     PT G CodesRamond Dial Aug 17, 2016, 3:43 PM    Mee Hives, PT MS Acute Rehab Dept. Number: [place] and [place]

## 2016-08-04 NOTE — Discharge Summary (Signed)
[facility] NAME: Brenda Collins    MR#:  [id number]  DATE OF BIRTH:  04-22-1930  DATE OF ADMISSION:  08/03/2016 ADMITTING PHYSICIAN: Fritzi Mandes, MD  DATE OF DISCHARGE: 08/04/2016  PRIMARY CARE PHYSICIAN: Leonel Ramsay, MD    ADMISSION DIAGNOSIS:  Hyponatremia [E87.1] Dyspnea [R06.00] Elevated troponin [R79.89] Atrial fibrillation with tachycardic ventricular rate ([place]) [I48.91]  DISCHARGE DIAGNOSIS:  Active Problems:   A-fib ([place])   SECONDARY DIAGNOSIS:   Past Medical History:  Diagnosis Date  . Breast cancer ([place])   . COPD (chronic obstructive pulmonary disease) ([place])   . Hypertension     HOSPITAL COURSE:   82 year old female with past medical history of breast cancer, hypertension, COPD who presented to the hospital due to palpitations and noted to be in atrial fibrillation with rapid ventricular response.  1. Atrial fibrillation with rapid ventricular response--patient was admitted to the hospital started on a Cardizem drip. -She was also started on oral Cardizem to help with rate control. She was seen by cardiology. She has not converted to normal sinus rhythm. -She is presently being discharged on oral Cardizem for rate control. As per cardiology to have a discussion about long-term anticoagulation with her as an outpatient before starting therapy. Her chads vasc or S4 and she likely would benefit from long-term anticoagulation. Continue aspirin for now. - pt. Did have an Echo done and results of which can be followed up as outpatient.    2. Essential hypertension-patient will continue her triamterene HCTZ and she was started on Cardizem for rate control.  3. COPD-she had no acute exacerbation. She will continue her Spiriva and Breo-Ellipta.  4. Hyponatremia - mild due to diuretics. Improved w/ IV Fluids.   DISCHARGE CONDITIONS:   Stable  CONSULTS OBTAINED:  Treatment Team:  Yolonda Kida,  MD Isaias Cowman, MD  DRUG ALLERGIES:   Allergies  Allergen Reactions  . Aspirin Other (See Comments)    Other (See Comments) - was taking 2 of the extra strength aspirins at the time.  Causes bleeding in stools   . Nabumetone     Other reaction(s): Other (See Comments) Blood in stools--all arthritis meds  . Penicillins Other (See Comments)    Joint swelling and aching Has patient had a PCN reaction causing immediate rash, facial/tongue/throat swelling, SOB or lightheadedness with hypotension: No Has patient had a PCN reaction causing severe rash involving mucus membranes or skin necrosis: No Has patient had a PCN reaction that required hospitalization No Has patient had a PCN reaction occurring within the last 10 years: No If all of the above answers are "NO", then may proceed with Cephalosporin use.     DISCHARGE MEDICATIONS:     Medication List    TAKE these medications   acetaminophen 500 MG tablet Commonly known as:  TYLENOL Take 1,000 mg by mouth 3 (three) times daily.   aspirin EC 81 MG tablet Take 1 tablet (81 mg total) by mouth daily.   benzocaine-menthol 6-10 MG lozenge Commonly known as:  CHLORAEPTIC Take 1 lozenge by mouth as needed.   diltiazem 180 MG 24 hr capsule Commonly known as:  CARDIZEM CD Take 1 capsule (180 mg total) by mouth daily. Start taking on:  08/05/2016   fluticasone furoate-vilanterol 100-25 MCG/INH Aepb Commonly known as:  BREO ELLIPTA Inhale 1 puff into the lungs daily.   magnesium oxide 400 MG tablet Commonly known as:  MAG-OX Take 400 mg by mouth at  bedtime.   predniSONE 10 MG tablet Commonly known as:  DELTASONE Take 10 mg by mouth daily with breakfast. Taper dose 40mg  for 2 days, 30mg  for 2 days 20mg  for 2 days and 10mg  for 2 days.   RA VITAMIN B-12 TR 1000 MCG Tbcr Generic drug:  Cyanocobalamin Take 1 tablet by mouth daily.   tiotropium 18 MCG inhalation capsule Commonly known as:  SPIRIVA Place 18 mcg into  inhaler and inhale daily.   triamterene-hydrochlorothiazide 37.5-25 MG tablet Commonly known as:  MAXZIDE-25 TAKE ONE (1) TABLET BY MOUTH EVERY DAY         DISCHARGE INSTRUCTIONS:   DIET:  Cardiac diet  DISCHARGE CONDITION:  Stable  ACTIVITY:  Activity as tolerated  OXYGEN:  Home Oxygen: No.   Oxygen Delivery: room air  DISCHARGE LOCATION:  home   If you experience worsening of your admission symptoms, develop shortness of breath, life threatening emergency, suicidal or homicidal thoughts you must seek medical attention immediately by calling 911 or calling your MD immediately  if symptoms less severe.  You Must read complete instructions/literature along with all the possible adverse reactions/side effects for all the Medicines you take and that have been prescribed to you. Take any new Medicines after you have completely understood and accpet all the possible adverse reactions/side effects.   Please note  You were cared for by a hospitalist during your hospital stay. If you have any questions about your discharge medications or the care you received while you were in the hospital after you are discharged, you can call the unit and asked to speak with the hospitalist on call if the hospitalist that took care of you is not available. Once you are discharged, your primary care physician will handle any further medical issues. Please note that NO REFILLS for any discharge medications will be authorized once you are discharged, as it is imperative that you return to your primary care physician (or establish a relationship with a primary care physician if you do not have one) for your aftercare needs so that they can reassess your need for medications and monitor your lab values.     Today   Now converted to NSR.  Denies any palpitations, or chest pain. Discussed w/ Cardiology and will d/c home today.   VITAL SIGNS:  Blood pressure (!) 156/47, pulse 76, temperature 98.7 F  (37.1 C), temperature source Oral, resp. rate 18, height 4\' 9"  (1.448 m), weight 76.7 kg (169 lb), SpO2 97 %.  I/O:   Intake/Output Summary (Last 24 hours) at 08/04/16 1448 Last data filed at 08/04/16 0600  Gross per 24 hour  Intake              585 ml  Output              250 ml  Net              335 ml    PHYSICAL EXAMINATION:  GENERAL:  82 y.o.-year-old patient lying in the bed with no acute distress.  EYES: Pupils equal, round, reactive to light and accommodation. No scleral icterus. Extraocular muscles intact.  HEENT: Head atraumatic, normocephalic. Oropharynx and nasopharynx clear.  NECK:  Supple, no jugular venous distention. No thyroid enlargement, no tenderness.  LUNGS: Normal breath sounds bilaterally, no wheezing, rales,rhonchi. No use of accessory muscles of respiration.  CARDIOVASCULAR: S1, S2 normal. No murmurs, rubs, or gallops.  ABDOMEN: Soft, non-tender, non-distended. Bowel sounds present. No organomegaly or mass.  EXTREMITIES: No  pedal edema, cyanosis, or clubbing.  NEUROLOGIC: Cranial nerves II through XII are intact. No focal motor or sensory defecits b/l.  PSYCHIATRIC: The patient is alert and oriented x 3. Good affect.  SKIN: No obvious rash, lesion, or ulcer.   DATA REVIEW:   CBC  Recent Labs Lab 08/03/16 1523  WBC 11.6*  HGB 14.9  HCT 43.1  PLT 293    Chemistries   Recent Labs Lab 08/03/16 1523 08/04/16 0613  NA 127* 132*  K 3.8 3.7  CL 92* 98*  CO2 24 27  GLUCOSE 169* 117*  BUN 19 15  CREATININE 0.63 0.61  CALCIUM 9.8 9.4  MG 2.1  --     Cardiac Enzymes  Recent Labs Lab 08/03/16 1523  TROPONINI 0.03*    Microbiology Results  No results found for this or any previous visit.  RADIOLOGY:  Dg Chest Portable 1 View  Result Date: 08/03/2016 CLINICAL DATA:  Patient states she has chest pain and SOB today which has worsened. Patient states she has a HX of COPD. EXAM: PORTABLE CHEST 1 VIEW COMPARISON:  None. FINDINGS: Normal  mediastinum and cardiac silhouette. Normal pulmonary vasculature. No evidence of effusion, infiltrate, or pneumothorax. No acute bony abnormality. IMPRESSION: No acute cardiopulmonary process. Electronically Signed   By: Suzy Bouchard M.D.   On: 08/03/2016 16:56      Management plans discussed with the patient, family and they are in agreement.  CODE STATUS:     Code Status Orders        Start     Ordered   08/03/16 2057  Full code  Continuous     08/03/16 2056    Code Status History    Date Active Date Inactive Code Status Order ID Comments User Context   08/03/2016  8:56 PM 08/04/2016  1:05 PM Full Code [id number]  Fritzi Mandes, MD Inpatient      TOTAL TIME TAKING CARE OF THIS PATIENT: 40 minutes.    Henreitta Leber M.D on 08/04/2016 at 2:48 PM  Between 7am to 6pm - Pager - [phone]  After 6pm go to [email or web address] - [employer] [facility]  Office  [phone]  CC: Primary care physician; Leonel Ramsay, MD

## 2016-08-04 NOTE — Progress Notes (Signed)
*  PRELIMINARY RESULTS* Echocardiogram 2D Echocardiogram has been performed.  Brenda Collins 08/04/2016, 3:11 PM

## 2016-08-04 NOTE — Consult Note (Signed)
[facility]  CARDIOLOGY CONSULT NOTE  Patient ID: Brenda Collins MRN: [id number] DOB/AGE: 1930/02/13 82 y.o.  Admit date: 08/03/2016 Referring Physician Verdell Carmine Primary Physician [facility] Primary Cardiologist Fath Reason for Consultation atrial fibrillation  HPI: The patient is an 82 year old female with history of COPD, pulmonary hypertension, and essential hypertension, referred for evaluation of atrial fibrillation. The patient apparently was a use state of health until yesterday, when she was noted to have elevated systolic blood pressure, with recent history of increasing shortness of breath, and presented to [facility] emergency room. In the emergency room, the patient was noted to be tachycardic, with atrial fibrillation with rapid ventricular rate, treated with intravenous Cardizem, and converted to sinus rhythm. Patient was also noted to be hyponatremic with a sodium of 127. The patient currently is taking triamterene/HCTZ. Admission labs were notable for negative troponin.  Review of systems complete and found to be negative unless listed above     Past Medical History:  Diagnosis Date  . Breast cancer ([place])   . COPD (chronic obstructive pulmonary disease) ([place])   . Hypertension     History reviewed. No pertinent surgical history.  Prescriptions Prior to Admission  Medication Sig Dispense Refill Last Dose  . acetaminophen (TYLENOL) 500 MG tablet Take 1,000 mg by mouth 3 (three) times daily.    08/03/2016 at 1000  . Cyanocobalamin (RA VITAMIN B-12 TR) 1000 MCG TBCR Take 1 tablet by mouth daily.    08/03/2016 at Unknown time  . Fluticasone Furoate-Vilanterol 100-25 MCG/INH AEPB Inhale 1 puff into the lungs daily.    08/03/2016 at Unknown time  . magnesium oxide (MAG-OX) 400 MG tablet Take 400 mg by mouth at bedtime.    08/02/2016 at Unknown time  . predniSONE (DELTASONE) 10 MG tablet Take 10 mg by mouth daily with breakfast. Taper dose 40mg  for 2 days, 30mg  for 2 days 20mg  for 2  days and 10mg  for 2 days.   08/03/2016 at Unknown time  . tiotropium (SPIRIVA) 18 MCG inhalation capsule Place 18 mcg into inhaler and inhale daily.    08/02/2016 at Unknown time  . triamterene-hydrochlorothiazide (MAXZIDE-25) 37.5-25 MG per tablet TAKE ONE (1) TABLET BY MOUTH EVERY DAY   08/03/2016 at 1300  . benzocaine-menthol (CHLORAEPTIC) 6-10 MG lozenge Take 1 lozenge by mouth as needed.    Not Taking   Social History   Social History  . Marital status: Widowed    Spouse name: N/A  . Number of children: N/A  . Years of education: N/A   Occupational History  . Not on file.   Social History Main Topics  . Smoking status: Former Smoker    Quit date: 12/11/1972  . Smokeless tobacco: Not on file  . Alcohol use No  . Drug use: No  . Sexual activity: Not on file   Other Topics Concern  . Not on file   Social History Narrative  . No narrative on file    History reviewed. No pertinent family history.    Review of systems complete and found to be negative unless listed above      PHYSICAL EXAM  General: Well developed, well nourished, in no acute distress HEENT:  Normocephalic and atramatic Neck:  No JVD.  Lungs: Clear bilaterally to auscultation and percussion. Heart: HRRR . Normal S1 and S2 without gallops or murmurs.  Abdomen: Bowel sounds are positive, abdomen soft and non-tender  Msk:  Back normal, normal gait. Normal strength and tone for age. Extremities: No clubbing, cyanosis  or edema.   Neuro: Alert and oriented X 3. Psych:  Good affect, responds appropriately  Labs:   Lab Results  Component Value Date   WBC 11.6 (H) 08/03/2016   HGB 14.9 08/03/2016   HCT 43.1 08/03/2016   MCV 93.8 08/03/2016   PLT 293 08/03/2016    Recent Labs Lab 08/04/16 0613  NA 132*  K 3.7  CL 98*  CO2 27  BUN 15  CREATININE 0.61  CALCIUM 9.4  GLUCOSE 117*   Lab Results  Component Value Date   TROPONINI 0.03 ([place]) 08/03/2016   No results found for: CHOL No results found  for: HDL No results found for: LDLCALC No results found for: TRIG No results found for: CHOLHDL No results found for: LDLDIRECT    Radiology: Dg Chest Portable 1 View  Result Date: 08/03/2016 CLINICAL DATA:  Patient states she has chest pain and SOB today which has worsened. Patient states she has a HX of COPD. EXAM: PORTABLE CHEST 1 VIEW COMPARISON:  None. FINDINGS: Normal mediastinum and cardiac silhouette. Normal pulmonary vasculature. No evidence of effusion, infiltrate, or pneumothorax. No acute bony abnormality. IMPRESSION: No acute cardiopulmonary process. Electronically Signed   By: Suzy Bouchard M.D.   On: 08/03/2016 16:56    EKG: Atrial fibrillation with rapid ventricular response  ASSESSMENT AND PLAN:   1. Paroxysmal atrial fibrillation, currently in sinus rhythm, chads Vasc 4. We discussed in detail, the risks, benefits and alternatives of chronic anticoagulation, as well as, the advantages and disadvantages of warfarin versus a novel anticoagulants. 2. Essential hypertension, blood pressure currently stable 3. COPD 4. Pulmonary hypertension  Recommendations  1. Agree with overall current therapy 2. Change Cardizem to Cardizem CD 180 mg daily 3. Continue aspirin for now. Chronic anticoagulation may be initiated as outpatient. 4. Review 2-D echocardiogram    Signed: Rhona Fusilier MD,PhD, [facility] 08/04/2016, 8:17 AM

## 2016-08-04 NOTE — Progress Notes (Signed)
Patient is being discharge in a stable condition, summary and f/u care given to both pt's and daughter , verbalized understanding , left with daughter

## 2016-08-05 LAB — ECHOCARDIOGRAM COMPLETE
Height: 57 in
Weight: 2704 oz

## 2016-08-10 ENCOUNTER — Ambulatory Visit: Payer: Medicare Other | Admitting: Radiation Oncology

## 2016-09-14 ENCOUNTER — Ambulatory Visit
Admission: RE | Admit: 2016-09-14 | Discharge: 2016-09-14 | Disposition: A | Discharge status: Home / Self Care | Payer: Medicare Other | Source: Ambulatory Visit | Attending: Radiation Oncology | Admitting: Radiation Oncology

## 2016-09-14 ENCOUNTER — Encounter: Payer: Self-pay | Admitting: Radiation Oncology

## 2016-09-14 ENCOUNTER — Other Ambulatory Visit: Payer: Self-pay | Admitting: *Deleted

## 2016-09-14 VITALS — Temp 97.2°F | Resp 18 | Wt 163.6 lb

## 2016-09-14 DIAGNOSIS — E871 Hypo-osmolality and hyponatremia: Secondary | ICD-10-CM | POA: Insufficient documentation

## 2016-09-14 DIAGNOSIS — I4891 Unspecified atrial fibrillation: Secondary | ICD-10-CM | POA: Insufficient documentation

## 2016-09-14 DIAGNOSIS — M129 Arthropathy, unspecified: Secondary | ICD-10-CM | POA: Diagnosis not present

## 2016-09-14 DIAGNOSIS — D0512 Intraductal carcinoma in situ of left breast: Secondary | ICD-10-CM

## 2016-09-14 DIAGNOSIS — Z853 Personal history of malignant neoplasm of breast: Secondary | ICD-10-CM | POA: Insufficient documentation

## 2016-09-14 DIAGNOSIS — Z923 Personal history of irradiation: Secondary | ICD-10-CM | POA: Insufficient documentation

## 2016-09-14 NOTE — Progress Notes (Signed)
Radiation Oncology Follow up Note  Name: Brenda Collins   Date:   09/14/2016 MRN:  [id number] DOB: 1930/04/02    This 82 y.o. 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  REFERRING PROVIDER: Leonel Ramsay, MD  HPI: Patient is an 82 year old female now out close to 5 years having completed whole breast radiation to her left breast for ER/PR positive ductal carcinoma in situ. She has significant arthritis has refused tamoxifen therapy. She seen today in routine follow-up is doing fair. She's had several comorbidity issues including irregular heartbeat., Hyponatremia. She had atrial fibrillation with tachycardic ventricular rate. This is improved was recently released from the hospital back in September. She's Mr. previous mammograms. She specifically denies breast tenderness cough or bone pain.  COMPLICATIONS OF TREATMENT: none  FOLLOW UP COMPLIANCE: keeps appointments   PHYSICAL EXAM:  Temp 97.2 F (36.2 C)   Resp 18   Wt 163 lb 9.3 oz (74.2 kg)   BMI 35.40 kg/m  Well-developed wheelchair bound obese female in NAD.Lungs are clear to A&P cardiac examination essentially unremarkable with regular rate and rhythm. No dominant mass or nodularity is noted in either breast in 2 positions examined. Incision is well-healed. No axillary or supraclavicular adenopathy is appreciated. Cosmetic result is excellent. Well-developed well-nourished patient in NAD. HEENT reveals PERLA, EOMI, discs not visualized.  Oral cavity is clear. No oral mucosal lesions are identified. Neck is clear without evidence of cervical or supraclavicular adenopathy. Lungs are clear to A&P. Cardiac examination is essentially unremarkable with regular rate and rhythm without murmur rub or thrill. Abdomen is benign with no organomegaly or masses noted. Motor sensory and DTR levels are equal and symmetric in the upper and  lower extremities. Cranial nerves II through XII are grossly intact. Proprioception is intact. No peripheral adenopathy or edema is identified. No motor or sensory levels are noted. Crude visual fields are within normal range.  RADIOLOGY RESULTS: Bilateral diagnostic mammograms were ordered  PLAN: Present time she is doing well with no evidence of disease. I have ordered bilateral diagnostic mammograms will follow up on them when available. Since no else is doing a breast exam or following her from a breast standpoint I'll continue follow-up care and see her back in 1 year for follow-up. Patient is to call sooner with any concerns.  I would like to take this opportunity to thank you for allowing me to participate in the care of your patient.Armstead Peaks., MD

## 2016-09-30 ENCOUNTER — Ambulatory Visit
Admission: RE | Admit: 2016-09-30 | Discharge: 2016-09-30 | Disposition: A | Discharge status: Home / Self Care | Payer: Medicare Other | Source: Ambulatory Visit | Attending: Radiation Oncology | Admitting: Radiation Oncology

## 2016-09-30 ENCOUNTER — Other Ambulatory Visit: Payer: Self-pay | Admitting: Radiation Oncology

## 2016-09-30 DIAGNOSIS — D0512 Intraductal carcinoma in situ of left breast: Secondary | ICD-10-CM

## 2016-12-15 IMAGING — DX DG CHEST 1V PORT
1 series · 1 of 1 positions shown · non-contrast
Comparison: None.

CLINICAL DATA: Patient states she has chest pain and SOB today
which has worsened. Patient states she has a HX of COPD.

EXAM:
PORTABLE CHEST 1 VIEW

[chest ap]
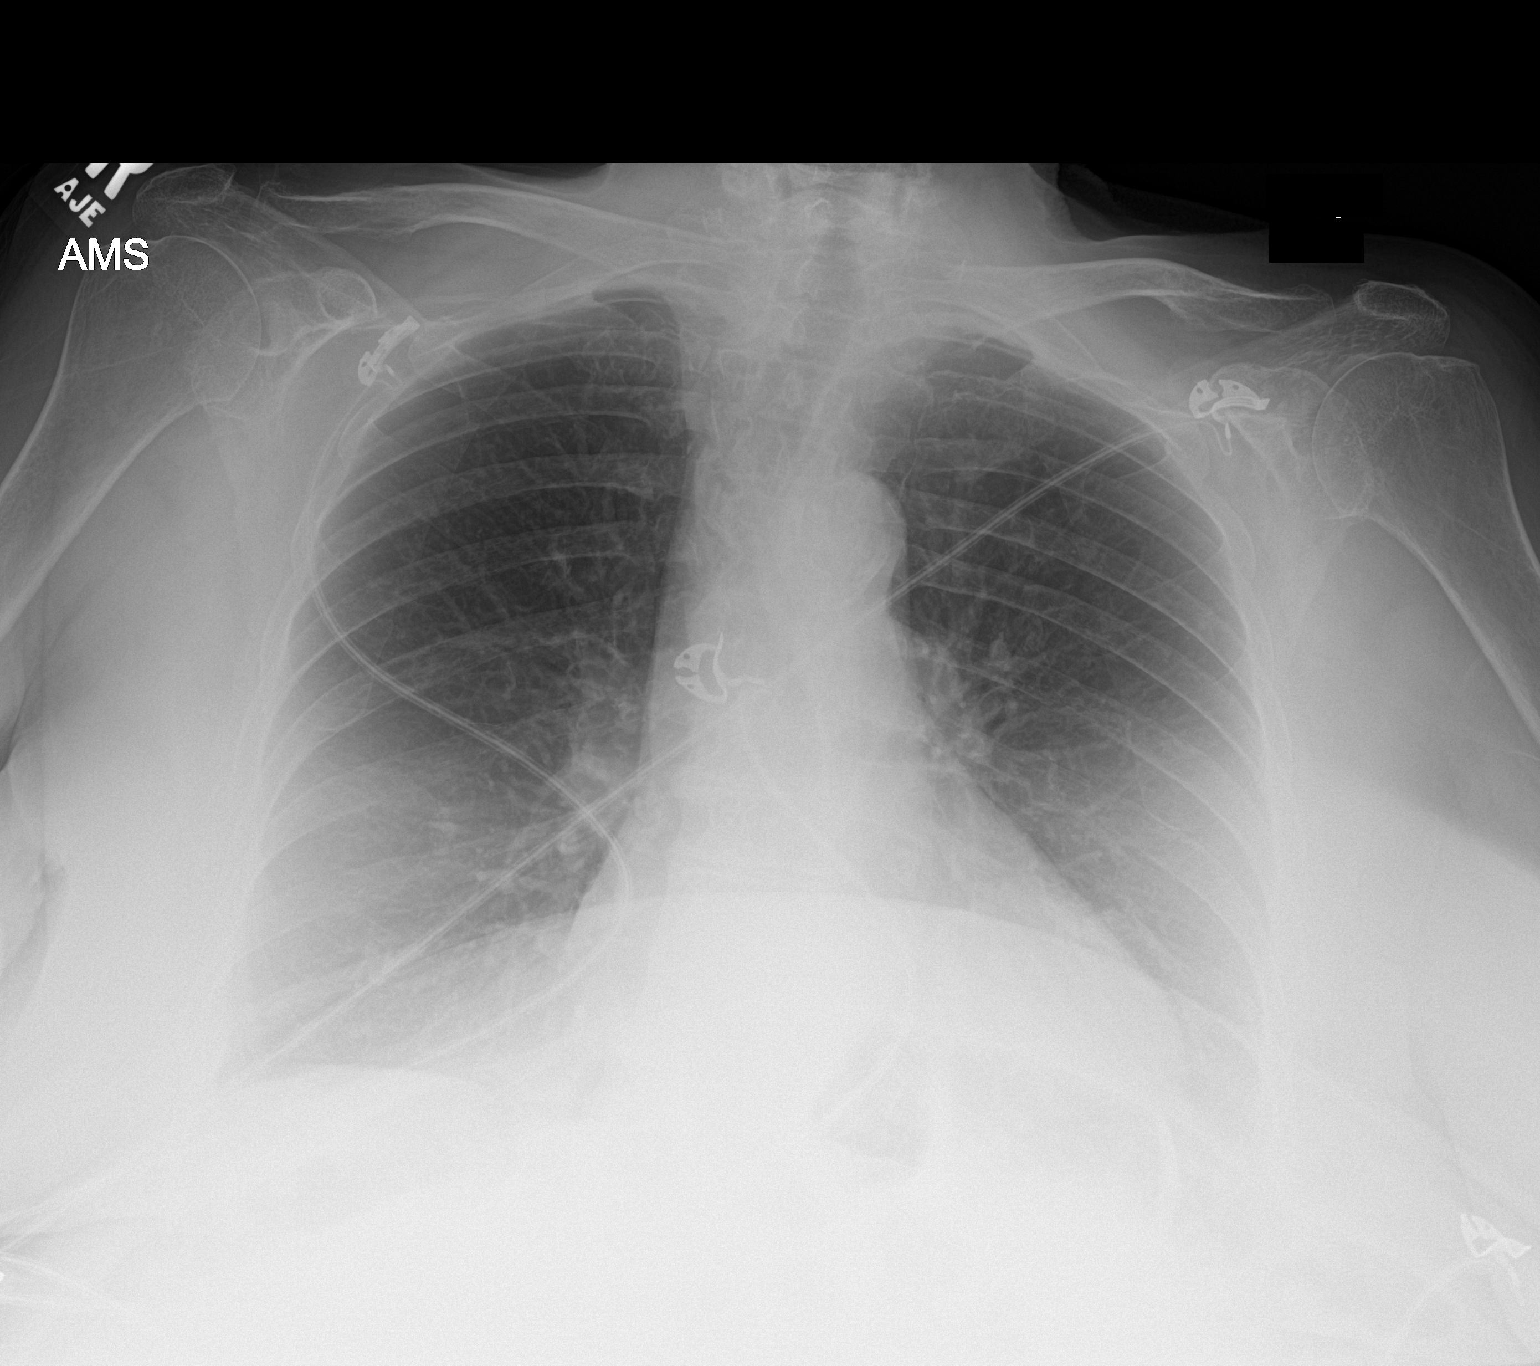

[1 of 1 positions shown; findings below may reference images not displayed]

FINDINGS: Normal mediastinum and cardiac silhouette. Normal pulmonary
vasculature. No evidence of effusion, infiltrate, or pneumothorax.
No acute bony abnormality.
IMPRESSION: No acute cardiopulmonary process.

## 2017-09-12 ENCOUNTER — Other Ambulatory Visit: Payer: Self-pay | Admitting: Infectious Diseases

## 2017-09-12 DIAGNOSIS — Z853 Personal history of malignant neoplasm of breast: Secondary | ICD-10-CM

## 2017-09-13 ENCOUNTER — Encounter: Payer: Self-pay | Admitting: Radiation Oncology

## 2017-09-13 ENCOUNTER — Ambulatory Visit
Admission: RE | Admit: 2017-09-13 | Discharge: 2017-09-13 | Disposition: A | Discharge status: Home / Self Care | Payer: Medicare Other | Source: Ambulatory Visit | Attending: Radiation Oncology | Admitting: Radiation Oncology

## 2017-09-13 VITALS — BP 172/82 | HR 74 | Temp 97.1°F | Wt 151.5 lb

## 2017-09-13 DIAGNOSIS — D0512 Intraductal carcinoma in situ of left breast: Secondary | ICD-10-CM

## 2017-09-13 NOTE — Progress Notes (Signed)
Radiation Oncology Follow up Note  Name: Brenda Collins   Date:   09/13/2017 MRN:  [id number] DOB: 09/16/1930    This 83 y.o. female presents to the clinic today for for near future follow-up status post radiation therapy to her left breast for ductal carcinoma in situ.  REFERRING PROVIDER: Leonel Ramsay, MD  HPI: Patient is an 83 year old female now seen out for half years having completed whole breast radiation to her left breast for ductal carcinoma in situ. Tumor was ER/PR positive has refused tamoxifen therapy. Her last mammogram was back in November 2017 and was BI-RADS 2 benign which I have reviewed. She specifically denies breast tenderness cough or bone pain. She does continue have problems with COPD and heart issues. She was recently hospitalized for hyponatremia as well as atrial fibrillation back in late [id number]  COMPLICATIONS OF TREATMENT: none  FOLLOW UP COMPLIANCE: keeps appointments   PHYSICAL EXAM:  BP (!) 172/82   Pulse 74   Temp (!) 97.1 F (36.2 C)   Wt 151 lb 7.3 oz (68.7 kg)   BMI 32.77 kg/m  Lungs are clear to A&P cardiac examination essentially unremarkable with regular rate and rhythm. No dominant mass or nodularity is noted in either breast in 2 positions examined. Incision is well-healed. No axillary or supraclavicular adenopathy is appreciated. Cosmetic result is excellent. Well-developed well-nourished patient in NAD. HEENT reveals PERLA, EOMI, discs not visualized.  Oral cavity is clear. No oral mucosal lesions are identified. Neck is clear without evidence of cervical or supraclavicular adenopathy. Lungs are clear to A&P. Cardiac examination is essentially unremarkable with regular rate and rhythm without murmur rub or thrill. Abdomen is benign with no organomegaly or masses noted. Motor sensory and DTR levels are equal and symmetric in the upper and lower extremities. Cranial nerves II through XII are grossly intact. Proprioception is intact. No  peripheral adenopathy or edema is identified. No motor or sensory levels are noted. Crude visual fields are within normal range.  RADIOLOGY RESULTS: Prior mammograms are reviewed and compatible with the above-stated findings  PLAN: Present time she continues to do well with no evidence of disease as far as breast cancer is concerned. She was somewhat hypertensive today and asked her to notify her PMD. I am going to terminate my follow-up care since she's for half years out. I would be happy to reevaluate her any time should consultation be indicated. Patient knows to call with any concerns at any time.  I would like to take this opportunity to thank you for allowing me to participate in the care of your patient.Armstead Peaks., MD

## 2017-10-19 ENCOUNTER — Other Ambulatory Visit: Payer: Medicare Other

## 2018-08-23 ENCOUNTER — Ambulatory Visit: Payer: Self-pay | Admitting: Family Medicine

## 2023-07-23 DEATH — deceased
